# Patient Record
Sex: Female | Born: 1965 | Race: Black or African American | Hispanic: No | Marital: Single | Smoking: Former smoker
Health system: Southern US, Community
[De-identification: ages and names within clinical notes are randomized; demographics above are authoritative.]

## PROBLEM LIST (undated history)

## (undated) DIAGNOSIS — U071 COVID-19: Secondary | ICD-10-CM

## (undated) DIAGNOSIS — N390 Urinary tract infection, site not specified: Secondary | ICD-10-CM

## (undated) DIAGNOSIS — Z9289 Personal history of other medical treatment: Secondary | ICD-10-CM

## (undated) DIAGNOSIS — G43909 Migraine, unspecified, not intractable, without status migrainosus: Secondary | ICD-10-CM

## (undated) DIAGNOSIS — D496 Neoplasm of unspecified behavior of brain: Secondary | ICD-10-CM

## (undated) DIAGNOSIS — R569 Unspecified convulsions: Secondary | ICD-10-CM

## (undated) DIAGNOSIS — T7840XA Allergy, unspecified, initial encounter: Secondary | ICD-10-CM

## (undated) HISTORY — PX: BRAIN TUMOR EXCISION: SHX577

## (undated) HISTORY — PX: KNEE SURGERY: SHX244

## (undated) HISTORY — DX: Migraine, unspecified, not intractable, without status migrainosus: G43.909

## (undated) HISTORY — DX: Allergy, unspecified, initial encounter: T78.40XA

## (undated) HISTORY — DX: Unspecified convulsions: R56.9

## (undated) HISTORY — DX: Personal history of other medical treatment: Z92.89

## (undated) HISTORY — PX: FOOT SURGERY: SHX648

## (undated) HISTORY — DX: Urinary tract infection, site not specified: N39.0

## (undated) HISTORY — DX: Neoplasm of unspecified behavior of brain: D49.6

## (undated) HISTORY — DX: COVID-19: U07.1

---

## 2000-09-03 ENCOUNTER — Encounter: Payer: Self-pay | Admitting: Emergency Medicine

## 2000-09-03 ENCOUNTER — Emergency Department (HOSPITAL_COMMUNITY): Admission: EM | Admit: 2000-09-03 | Discharge: 2000-09-04 | Payer: Self-pay | Admitting: Emergency Medicine

## 2000-11-27 ENCOUNTER — Emergency Department (HOSPITAL_COMMUNITY): Admission: EM | Admit: 2000-11-27 | Discharge: 2000-11-27 | Payer: Self-pay | Admitting: Emergency Medicine

## 2000-12-03 ENCOUNTER — Ambulatory Visit (HOSPITAL_BASED_OUTPATIENT_CLINIC_OR_DEPARTMENT_OTHER): Admission: RE | Admit: 2000-12-03 | Discharge: 2000-12-03 | Payer: Self-pay | Admitting: Orthopedic Surgery

## 2004-05-30 ENCOUNTER — Other Ambulatory Visit: Payer: Self-pay

## 2004-07-13 ENCOUNTER — Emergency Department: Payer: Self-pay | Admitting: Emergency Medicine

## 2004-08-09 ENCOUNTER — Emergency Department: Payer: Self-pay | Admitting: Emergency Medicine

## 2004-08-16 ENCOUNTER — Emergency Department: Payer: Self-pay | Admitting: Emergency Medicine

## 2004-08-17 ENCOUNTER — Emergency Department: Payer: Self-pay | Admitting: Emergency Medicine

## 2004-10-26 ENCOUNTER — Emergency Department: Payer: Self-pay | Admitting: Internal Medicine

## 2004-11-25 ENCOUNTER — Emergency Department: Payer: Self-pay | Admitting: Emergency Medicine

## 2005-01-14 ENCOUNTER — Emergency Department: Payer: Self-pay | Admitting: General Practice

## 2005-02-09 ENCOUNTER — Emergency Department: Payer: Self-pay | Admitting: Emergency Medicine

## 2005-02-22 ENCOUNTER — Emergency Department: Payer: Self-pay | Admitting: Emergency Medicine

## 2005-06-16 ENCOUNTER — Emergency Department: Payer: Self-pay | Admitting: Emergency Medicine

## 2005-08-08 ENCOUNTER — Emergency Department: Payer: Self-pay | Admitting: Emergency Medicine

## 2006-05-24 ENCOUNTER — Emergency Department: Payer: Self-pay | Admitting: Emergency Medicine

## 2006-10-04 ENCOUNTER — Emergency Department: Payer: Self-pay

## 2006-10-10 ENCOUNTER — Emergency Department: Payer: Self-pay | Admitting: Emergency Medicine

## 2007-02-05 ENCOUNTER — Emergency Department: Payer: Self-pay | Admitting: Internal Medicine

## 2007-04-18 ENCOUNTER — Emergency Department: Payer: Self-pay | Admitting: Emergency Medicine

## 2007-05-08 ENCOUNTER — Emergency Department: Payer: Self-pay | Admitting: Emergency Medicine

## 2007-06-18 ENCOUNTER — Emergency Department: Payer: Self-pay | Admitting: Emergency Medicine

## 2007-08-07 ENCOUNTER — Emergency Department: Payer: Self-pay | Admitting: Internal Medicine

## 2007-09-05 ENCOUNTER — Ambulatory Visit: Payer: Self-pay | Admitting: Pain Medicine

## 2007-09-25 ENCOUNTER — Emergency Department: Payer: Self-pay | Admitting: Internal Medicine

## 2007-10-13 ENCOUNTER — Emergency Department: Payer: Self-pay | Admitting: Emergency Medicine

## 2007-11-01 ENCOUNTER — Ambulatory Visit: Payer: Self-pay | Admitting: Pain Medicine

## 2007-11-20 ENCOUNTER — Emergency Department: Payer: Self-pay | Admitting: Emergency Medicine

## 2007-12-12 ENCOUNTER — Emergency Department: Payer: Self-pay | Admitting: Emergency Medicine

## 2007-12-21 ENCOUNTER — Ambulatory Visit: Payer: Self-pay | Admitting: Pain Medicine

## 2008-02-07 ENCOUNTER — Emergency Department: Payer: Self-pay | Admitting: Emergency Medicine

## 2008-03-13 ENCOUNTER — Emergency Department: Payer: Self-pay | Admitting: Emergency Medicine

## 2008-05-03 ENCOUNTER — Emergency Department: Payer: Self-pay | Admitting: Emergency Medicine

## 2008-05-30 ENCOUNTER — Ambulatory Visit: Payer: Self-pay

## 2008-06-04 ENCOUNTER — Emergency Department: Payer: Self-pay | Admitting: Internal Medicine

## 2008-06-05 ENCOUNTER — Ambulatory Visit: Payer: Self-pay

## 2008-07-11 ENCOUNTER — Ambulatory Visit: Payer: Self-pay | Admitting: Pain Medicine

## 2008-07-29 ENCOUNTER — Emergency Department: Payer: Self-pay | Admitting: Emergency Medicine

## 2008-08-28 ENCOUNTER — Ambulatory Visit: Payer: Self-pay | Admitting: Physician Assistant

## 2008-10-11 ENCOUNTER — Emergency Department: Payer: Self-pay | Admitting: Internal Medicine

## 2008-10-24 ENCOUNTER — Ambulatory Visit: Payer: Self-pay | Admitting: Pain Medicine

## 2008-11-19 ENCOUNTER — Emergency Department: Payer: Self-pay

## 2008-11-29 ENCOUNTER — Ambulatory Visit: Payer: Self-pay | Admitting: Physician Assistant

## 2008-12-18 ENCOUNTER — Emergency Department: Payer: Self-pay | Admitting: Emergency Medicine

## 2008-12-26 ENCOUNTER — Ambulatory Visit: Payer: Self-pay | Admitting: Physician Assistant

## 2009-02-25 ENCOUNTER — Emergency Department: Payer: Self-pay | Admitting: Emergency Medicine

## 2009-05-07 ENCOUNTER — Ambulatory Visit: Payer: Self-pay | Admitting: Pain Medicine

## 2009-05-22 ENCOUNTER — Ambulatory Visit: Payer: Self-pay | Admitting: Physician Assistant

## 2009-07-17 ENCOUNTER — Ambulatory Visit: Payer: Self-pay

## 2009-07-23 ENCOUNTER — Ambulatory Visit: Payer: Self-pay | Admitting: Physician Assistant

## 2009-08-15 ENCOUNTER — Emergency Department: Payer: Self-pay | Admitting: Unknown Physician Specialty

## 2009-09-04 ENCOUNTER — Ambulatory Visit: Payer: Self-pay | Admitting: Unknown Physician Specialty

## 2011-03-08 ENCOUNTER — Emergency Department: Payer: Self-pay | Admitting: Emergency Medicine

## 2012-05-01 ENCOUNTER — Emergency Department: Payer: Self-pay | Admitting: Unknown Physician Specialty

## 2012-05-01 LAB — URINALYSIS, COMPLETE
Bilirubin,UR: NEGATIVE
Blood: NEGATIVE
Glucose,UR: NEGATIVE mg/dL (ref 0–75)
Ketone: NEGATIVE
Nitrite: POSITIVE
Ph: 5 (ref 4.5–8.0)
Protein: NEGATIVE
RBC,UR: NONE SEEN /HPF (ref 0–5)
Specific Gravity: 1.026 (ref 1.003–1.030)
Squamous Epithelial: 4
WBC UR: 16 /HPF (ref 0–5)

## 2012-05-18 ENCOUNTER — Emergency Department: Payer: Self-pay | Admitting: Emergency Medicine

## 2012-07-02 ENCOUNTER — Emergency Department: Payer: Self-pay | Admitting: Emergency Medicine

## 2012-07-02 LAB — PREGNANCY, URINE: Pregnancy Test, Urine: NEGATIVE m[IU]/mL

## 2012-07-02 LAB — CBC
HCT: 37 % (ref 35.0–47.0)
HGB: 12.6 g/dL (ref 12.0–16.0)
MCH: 28.4 pg (ref 26.0–34.0)
MCHC: 33.9 g/dL (ref 32.0–36.0)
MCV: 84 fL (ref 80–100)
Platelet: 263 10*3/uL (ref 150–440)
RBC: 4.43 10*6/uL (ref 3.80–5.20)
RDW: 15.6 % — ABNORMAL HIGH (ref 11.5–14.5)
WBC: 13.3 10*3/uL — ABNORMAL HIGH (ref 3.6–11.0)

## 2012-07-02 LAB — URINALYSIS, COMPLETE
Bilirubin,UR: NEGATIVE
Glucose,UR: NEGATIVE mg/dL (ref 0–75)
Ketone: NEGATIVE
Leukocyte Esterase: NEGATIVE
Nitrite: NEGATIVE
Ph: 6 (ref 4.5–8.0)
Protein: 25
RBC,UR: 2570 /HPF (ref 0–5)
Specific Gravity: 1.03 (ref 1.003–1.030)
Squamous Epithelial: 2
WBC UR: 4 /HPF (ref 0–5)

## 2012-08-20 ENCOUNTER — Emergency Department: Payer: Self-pay | Admitting: Emergency Medicine

## 2013-02-24 ENCOUNTER — Emergency Department: Payer: Self-pay | Admitting: Emergency Medicine

## 2013-02-24 LAB — URINALYSIS, COMPLETE
Bilirubin,UR: NEGATIVE
Blood: NEGATIVE
Glucose,UR: NEGATIVE mg/dL (ref 0–75)
Ketone: NEGATIVE
Ph: 6 (ref 4.5–8.0)
Protein: NEGATIVE
RBC,UR: 9 /HPF (ref 0–5)
Specific Gravity: 1.024 (ref 1.003–1.030)
WBC UR: 42 /HPF (ref 0–5)

## 2013-02-25 LAB — WET PREP, GENITAL

## 2013-02-25 LAB — GC/CHLAMYDIA PROBE AMP

## 2013-03-13 ENCOUNTER — Emergency Department: Payer: Self-pay | Admitting: Emergency Medicine

## 2013-03-13 LAB — COMPREHENSIVE METABOLIC PANEL
Alkaline Phosphatase: 52 U/L (ref 50–136)
Anion Gap: 7 (ref 7–16)
BUN: 17 mg/dL (ref 7–18)
Bilirubin,Total: 0.3 mg/dL (ref 0.2–1.0)
Co2: 27 mmol/L (ref 21–32)
Creatinine: 0.86 mg/dL (ref 0.60–1.30)
EGFR (Non-African Amer.): >60
Glucose: 90 mg/dL (ref 65–99)
Potassium: 3.5 mmol/L (ref 3.5–5.1)
SGOT(AST): 14 U/L — ABNORMAL LOW (ref 15–37)
SGPT (ALT): 24 U/L (ref 12–78)
Total Protein: 8.5 g/dL — ABNORMAL HIGH (ref 6.4–8.2)

## 2013-03-13 LAB — URINALYSIS, COMPLETE
Bilirubin,UR: NEGATIVE
Glucose,UR: NEGATIVE mg/dL (ref 0–75)
Ketone: NEGATIVE
Nitrite: NEGATIVE
RBC,UR: 1 /HPF (ref 0–5)
Specific Gravity: 1.03 (ref 1.003–1.030)
Squamous Epithelial: 5
WBC UR: 2 /HPF (ref 0–5)

## 2013-03-13 LAB — CBC
HCT: 37 % (ref 35.0–47.0)
HGB: 12.3 g/dL (ref 12.0–16.0)
MCH: 27.5 pg (ref 26.0–34.0)
MCHC: 33.2 g/dL (ref 32.0–36.0)
RDW: 14.8 % — ABNORMAL HIGH (ref 11.5–14.5)

## 2013-03-13 LAB — TROPONIN I: Troponin-I: <0.02 ng/mL

## 2013-04-17 ENCOUNTER — Emergency Department: Payer: Self-pay | Admitting: Emergency Medicine

## 2013-04-17 LAB — URINALYSIS, COMPLETE
Blood: NEGATIVE
Glucose,UR: NEGATIVE mg/dL (ref 0–75)
Leukocyte Esterase: NEGATIVE
Ph: 6 (ref 4.5–8.0)
Protein: NEGATIVE
RBC,UR: 1 /HPF (ref 0–5)
WBC UR: 2 /HPF (ref 0–5)

## 2013-06-27 ENCOUNTER — Emergency Department (HOSPITAL_COMMUNITY)
Admission: EM | Admit: 2013-06-27 | Discharge: 2013-06-27 | Disposition: A | Discharge status: Home / Self Care | Payer: Worker's Compensation | Attending: Emergency Medicine | Admitting: Emergency Medicine

## 2013-06-27 ENCOUNTER — Encounter (HOSPITAL_COMMUNITY): Payer: Self-pay | Admitting: Emergency Medicine

## 2013-06-27 DIAGNOSIS — G8911 Acute pain due to trauma: Secondary | ICD-10-CM | POA: Insufficient documentation

## 2013-06-27 DIAGNOSIS — M25569 Pain in unspecified knee: Secondary | ICD-10-CM | POA: Insufficient documentation

## 2013-06-27 DIAGNOSIS — M25562 Pain in left knee: Secondary | ICD-10-CM

## 2013-06-27 MED ORDER — OXYCODONE-ACETAMINOPHEN 5-325 MG PO TABS
2.0000 | ORAL_TABLET | Freq: Once | ORAL | Status: AC
  Administered 2013-06-27: 2 via ORAL
  Filled 2013-06-27: qty 2

## 2013-06-27 MED ORDER — OXYCODONE-ACETAMINOPHEN 5-325 MG PO TABS: 2.0000 | ORAL_TABLET | Freq: Four times a day (QID) | ORAL | Status: DC | PRN

## 2013-06-27 MED ORDER — ONDANSETRON HCL 4 MG PO TABS: 4.0000 mg | ORAL_TABLET | Freq: Four times a day (QID) | ORAL | Status: DC

## 2013-06-27 MED ORDER — ONDANSETRON 8 MG PO TBDP
8.0000 mg | ORAL_TABLET | Freq: Once | ORAL | Status: AC
Start: 2013-06-27 — End: 2013-06-27
  Administered 2013-06-27: 8 mg via ORAL
  Filled 2013-06-27: qty 1
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
## 2013-06-29 NOTE — ED Provider Notes (Signed)
Medical screening examination/treatment/procedure(s) were performed by non-physician practitioner and as supervising physician I was immediately available for consultation/collaboration.     Bohdan Macho R Oleta Gunnoe, MD 06/29/13 0000 

## 2013-07-13 ENCOUNTER — Emergency Department: Payer: Self-pay | Admitting: Emergency Medicine

## 2013-07-13 LAB — URINALYSIS, COMPLETE
Bacteria: NONE SEEN
Ph: 5 (ref 4.5–8.0)

## 2013-11-27 IMAGING — US TRANSABDOMINAL ULTRASOUND OF PELVIS
1 series · 14 of 25 positions shown · non-contrast
Comparison: none

REASON FOR EXAM: HEAVY VAG BLEEDING
COMMENTS:   May transport without cardiac monitor

PROCEDURE:     US  - US PELVIS EXAM  - July 02, 2012  [DATE]
RESULT:     History: Vaginal bleeding.

[Series 1: transabdominal ultrasound of pelvis · 0.21mm/px · 14 of 43 slices shown]
[im 1/43]
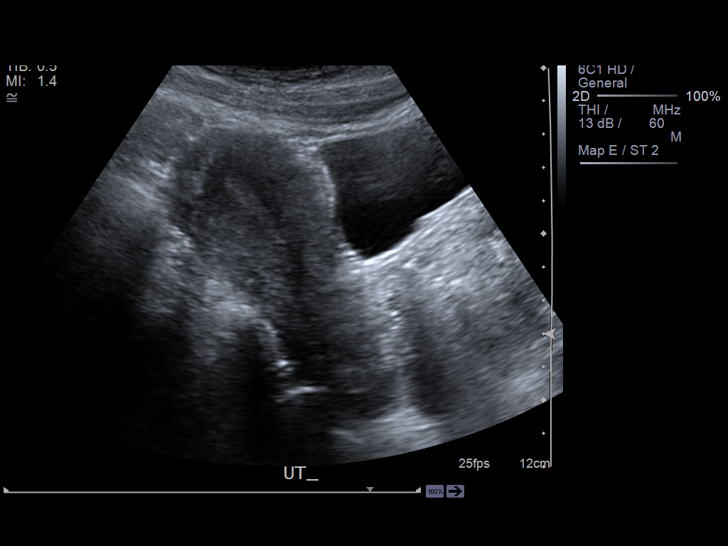
[im 4/43]
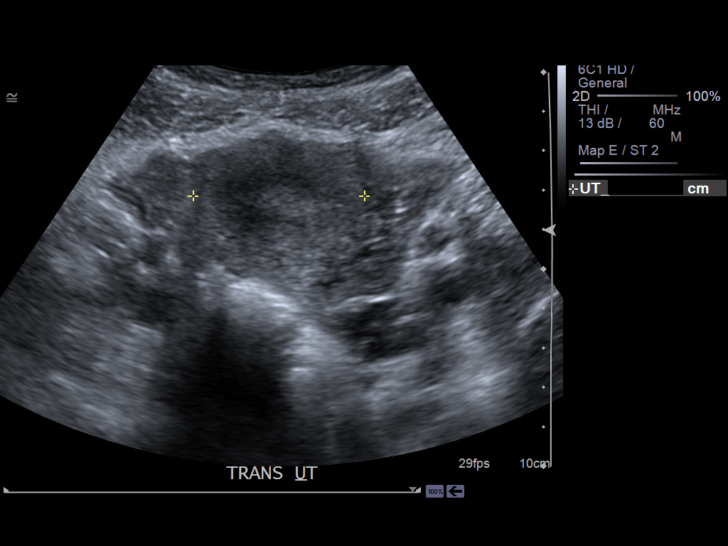
[im 8/43]
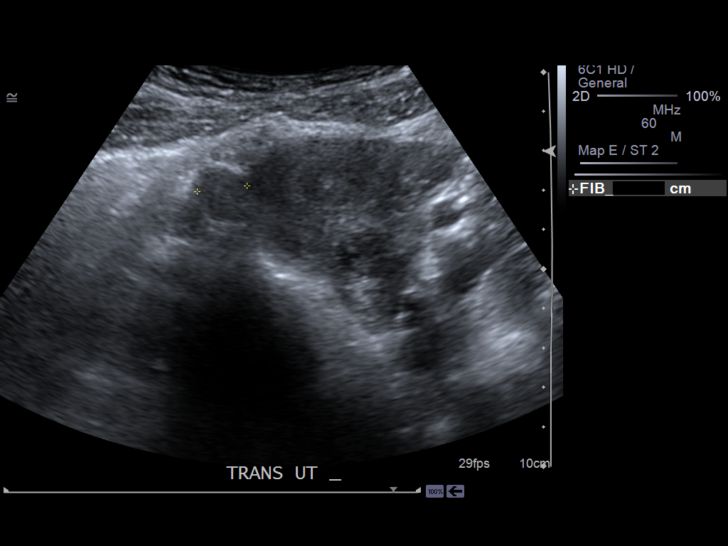
[im 11/43]
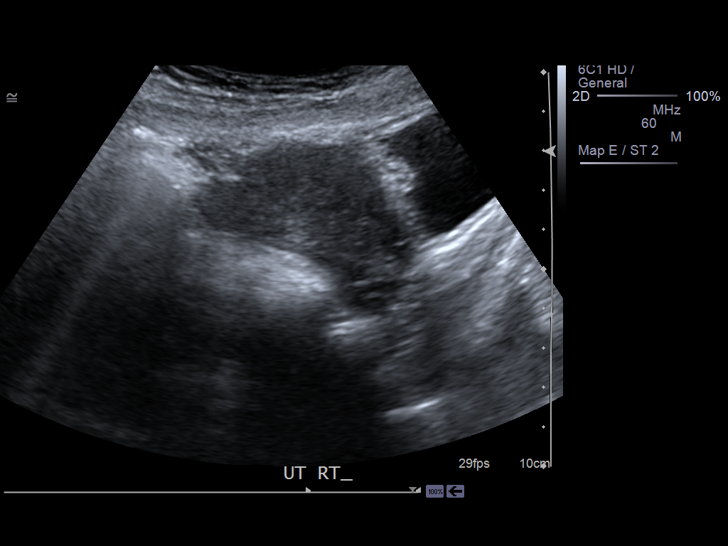
[im 15/43]
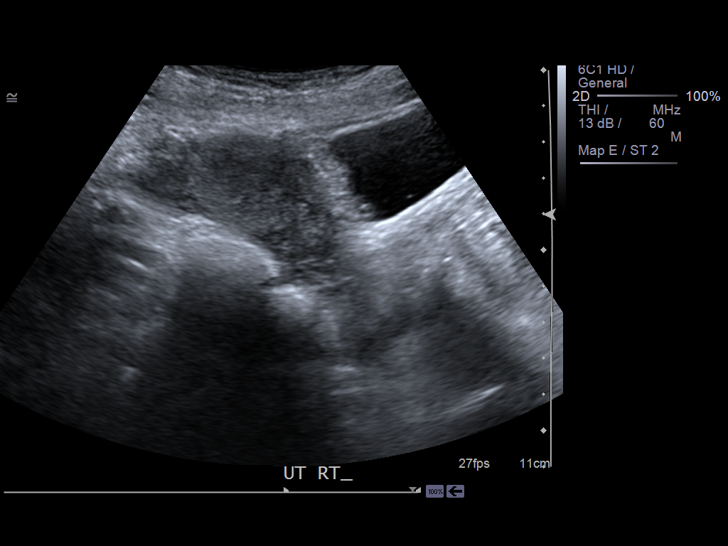
[im 16/43]
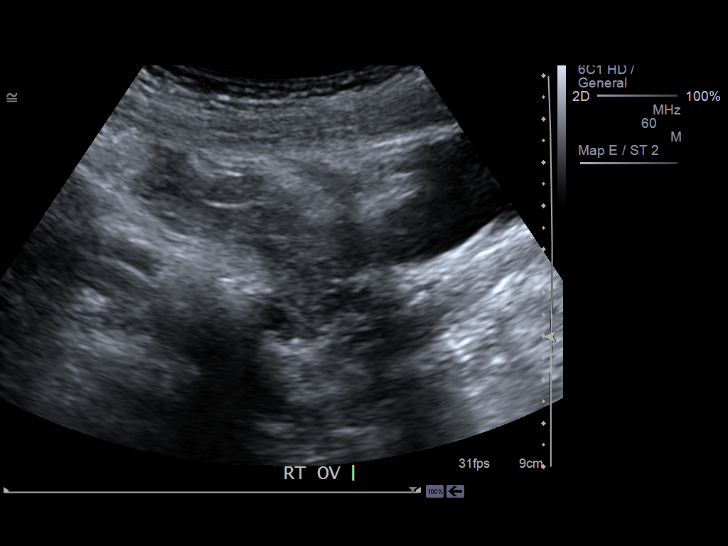
[im 20/43]
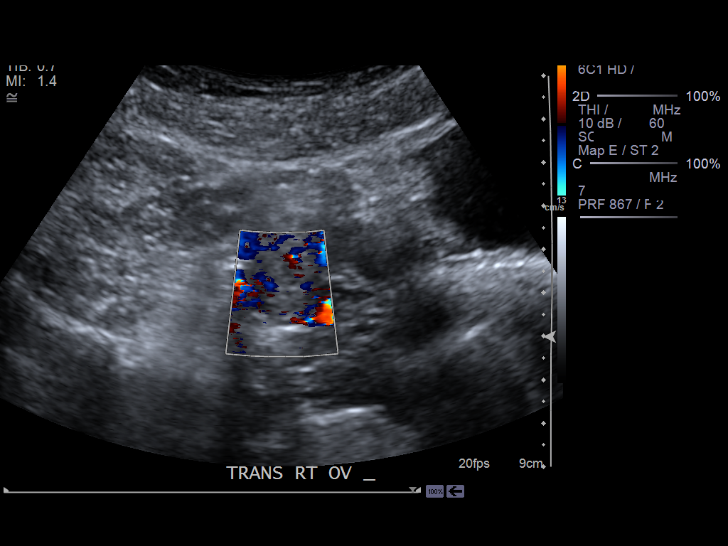
[im 23/43]
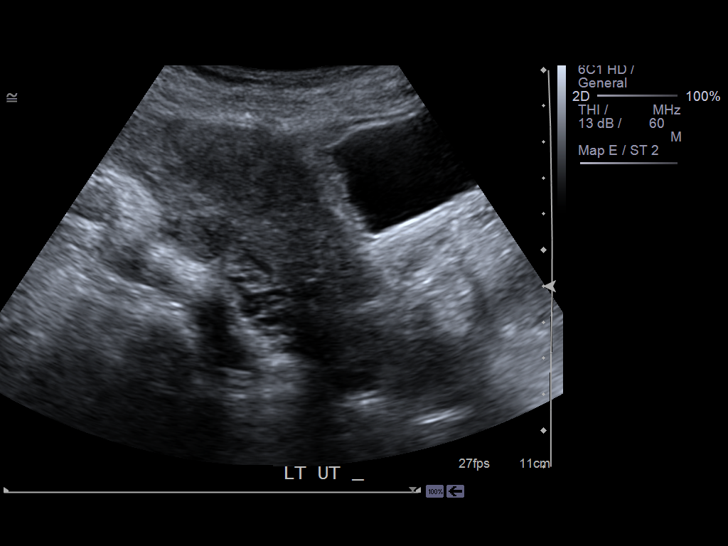
[im 27/43]
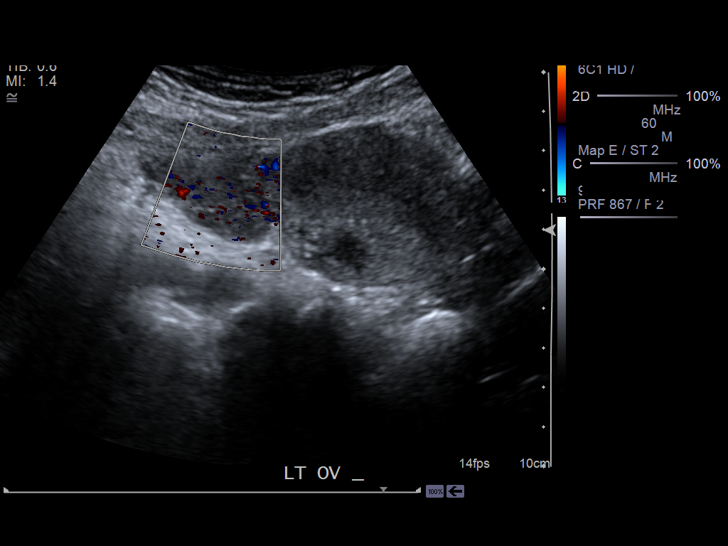
[im 29/43]
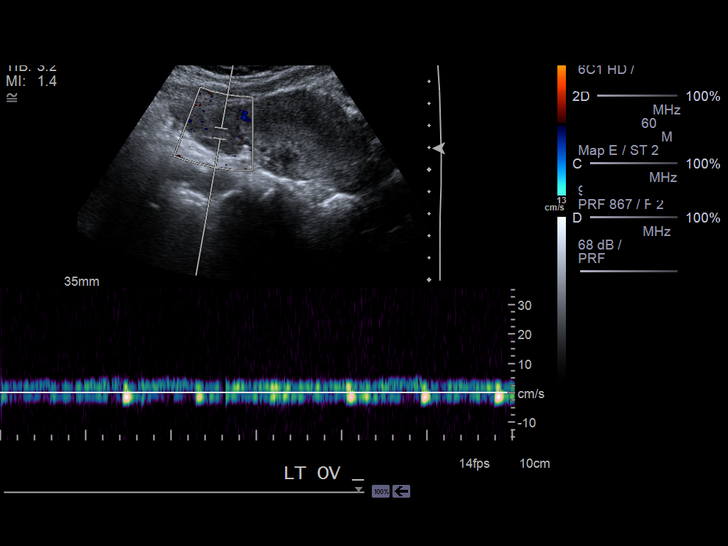
[im 32/43]
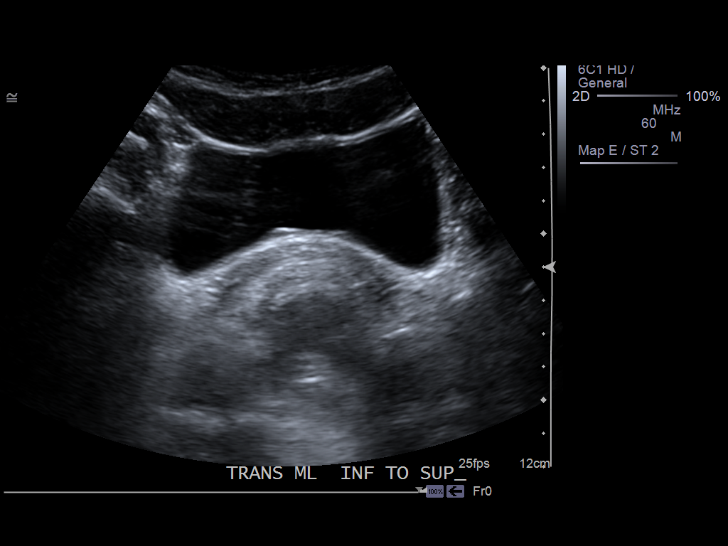
[im 36/43]
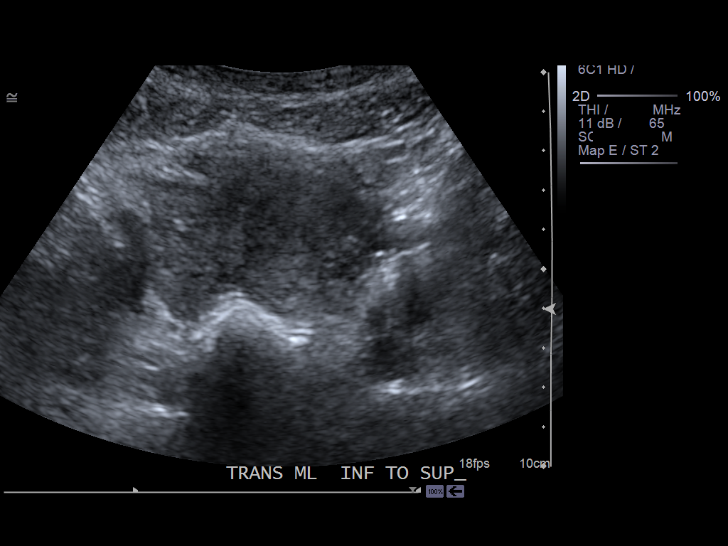
[im 39/43]
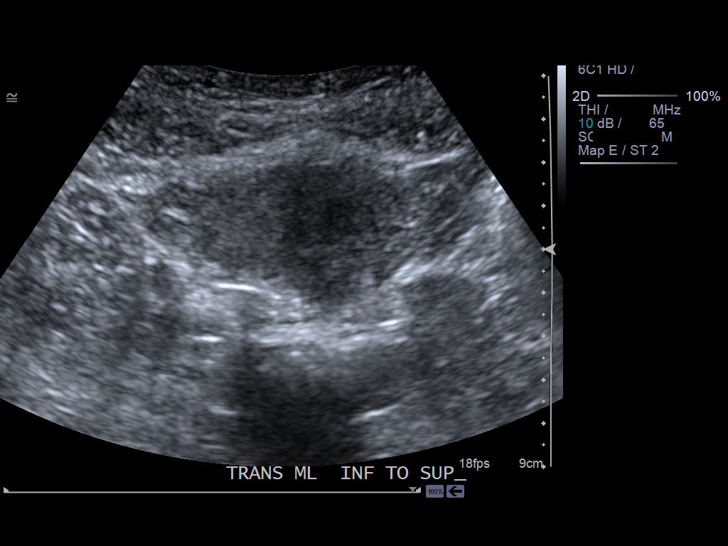
[im 43/43]
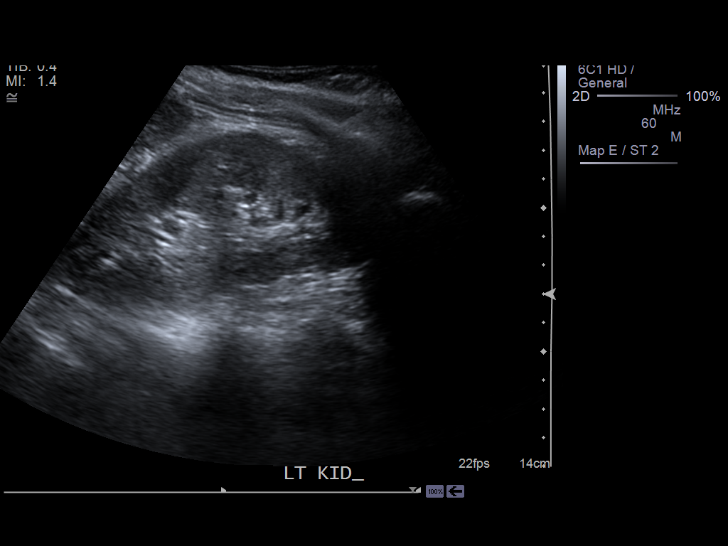

[14 of 25 positions shown; findings below may reference images not displayed]

FINDINGS: Size, shape, and echotexture uterus is normal. Two approximately
1.6 cm uterine fibroids are noted; endometrial thickness is 7.5 mm. Ovaries
are normal with normal bilateral blood flow. Maximum  diameter right ovary
2.4 cm, left 3.4 cm. No hydronephrosis.
IMPRESSION: Fibroid uterus, otherwise normal exam.

## 2014-02-27 ENCOUNTER — Emergency Department: Payer: Self-pay | Admitting: Emergency Medicine

## 2014-02-27 LAB — BASIC METABOLIC PANEL
Anion Gap: 5 — ABNORMAL LOW (ref 7–16)
BUN: 14 mg/dL (ref 7–18)
Calcium, Total: 9.6 mg/dL (ref 8.5–10.1)
Chloride: 105 mmol/L (ref 98–107)
Co2: 30 mmol/L (ref 21–32)
Creatinine: 0.84 mg/dL (ref 0.60–1.30)
EGFR (African American): >60
EGFR (Non-African Amer.): >60
Glucose: 98 mg/dL (ref 65–99)
OSMOLALITY: 280 (ref 275–301)
POTASSIUM: 4.1 mmol/L (ref 3.5–5.1)
Sodium: 140 mmol/L (ref 136–145)

## 2014-02-27 LAB — TROPONIN I: Troponin-I: <0.02 ng/mL

## 2014-02-27 LAB — CBC WITH DIFFERENTIAL/PLATELET
Basophil #: 0.1 10*3/uL (ref 0.0–0.1)
Basophil %: 0.6 %
Eosinophil #: 0.3 10*3/uL (ref 0.0–0.7)
Eosinophil %: 2 %
HCT: 39.5 % (ref 35.0–47.0)
HGB: 12.9 g/dL (ref 12.0–16.0)
Lymphocyte #: 6.2 10*3/uL — ABNORMAL HIGH (ref 1.0–3.6)
Lymphocyte %: 38.6 %
MCH: 27.4 pg (ref 26.0–34.0)
MCHC: 32.7 g/dL (ref 32.0–36.0)
MCV: 84 fL (ref 80–100)
Monocyte #: 1 x10 3/mm — ABNORMAL HIGH (ref 0.2–0.9)
Monocyte %: 6 %
NEUTROS ABS: 8.5 10*3/uL — AB (ref 1.4–6.5)
Neutrophil %: 52.8 %
Platelet: 241 10*3/uL (ref 150–440)
RBC: 4.72 10*6/uL (ref 3.80–5.20)
RDW: 15.2 % — ABNORMAL HIGH (ref 11.5–14.5)
WBC: 16.1 10*3/uL — ABNORMAL HIGH (ref 3.6–11.0)

## 2015-06-10 ENCOUNTER — Emergency Department
Admission: EM | Admit: 2015-06-10 | Discharge: 2015-06-10 | Disposition: A | Discharge status: Home / Self Care | Payer: Self-pay | Attending: Emergency Medicine | Admitting: Emergency Medicine

## 2015-06-10 ENCOUNTER — Encounter: Payer: Self-pay | Admitting: Emergency Medicine

## 2015-06-10 DIAGNOSIS — Z79899 Other long term (current) drug therapy: Secondary | ICD-10-CM | POA: Insufficient documentation

## 2015-06-10 DIAGNOSIS — A599 Trichomoniasis, unspecified: Secondary | ICD-10-CM

## 2015-06-10 DIAGNOSIS — R21 Rash and other nonspecific skin eruption: Secondary | ICD-10-CM | POA: Insufficient documentation

## 2015-06-10 DIAGNOSIS — Z3202 Encounter for pregnancy test, result negative: Secondary | ICD-10-CM | POA: Insufficient documentation

## 2015-06-10 DIAGNOSIS — A5909 Other urogenital trichomoniasis: Secondary | ICD-10-CM | POA: Insufficient documentation

## 2015-06-10 DIAGNOSIS — N898 Other specified noninflammatory disorders of vagina: Secondary | ICD-10-CM

## 2015-06-10 DIAGNOSIS — Z720 Tobacco use: Secondary | ICD-10-CM | POA: Insufficient documentation

## 2015-06-10 LAB — URINALYSIS COMPLETE WITH MICROSCOPIC (ARMC ONLY)
Bilirubin Urine: NEGATIVE
Glucose, UA: NEGATIVE mg/dL
HGB URINE DIPSTICK: NEGATIVE
Nitrite: NEGATIVE
PH: 5 (ref 5.0–8.0)
PROTEIN: NEGATIVE mg/dL
SPECIFIC GRAVITY, URINE: 1.031 — AB (ref 1.005–1.030)

## 2015-06-10 LAB — WET PREP, GENITAL
CLUE CELLS WET PREP: NONE SEEN
YEAST WET PREP: NONE SEEN

## 2015-06-10 LAB — CHLAMYDIA/NGC RT PCR (ARMC ONLY)
CHLAMYDIA TR: NOT DETECTED
N gonorrhoeae: NOT DETECTED

## 2015-06-10 LAB — POCT PREGNANCY, URINE: PREG TEST UR: NEGATIVE

## 2015-06-10 MED ORDER — METRONIDAZOLE 500 MG PO TABS: 500.0000 mg | ORAL_TABLET | Freq: Two times a day (BID) | ORAL | Status: AC

## 2015-06-10 MED ORDER — METRONIDAZOLE 500 MG PO TABS
500.0000 mg | ORAL_TABLET | Freq: Once | ORAL | Status: AC
  Administered 2015-06-10: 500 mg via ORAL
  Filled 2015-06-10: qty 1

## 2015-06-10 NOTE — ED Provider Notes (Signed)
[facility] Emergency Department Provider Note  ____________________________________________  Time seen: Approximately 238 AM  I have reviewed the triage vital signs and the nursing notes.   HISTORY  Chief Complaint Vaginal Discharge    HPI Carmen Cooley is a 49 y.o. female who comes in with vaginal discharge. The patient reports that she has a rash on her thighs where they rub together. She reports that yesterday started having some vaginal discharge it was very bad. The patient reports that she's been wetting through her close in her pants so she stopped wearing pants to let air try her vagina. The patient reports that she has continued having discharge so severe that it is irritating the rash on her thighs. She reports that the discharge is a grayish white discharge. The patient thought it may be yeast. She reports that she's had some itching but no more than normal. The patient reports that she is also going through menopause she denies any foreign body and she was sexually active one week ago without protection. The patient was concerned so she decided to come in for evaluation   History reviewed. No pertinent past medical history.  There are no active problems to display for this patient.   Past Surgical History  Procedure Laterality Date  . Brain tumor excision      benign  . Knee surgery    . Foot surgery      Current Outpatient Rx  Name  Route  Sig  Dispense  Refill  . HYDROcodone-acetaminophen (NORCO/VICODIN) 5-325 MG per tablet   Oral   Take 1-2 tablets by mouth at bedtime as needed for pain.         Marland Kitchen ibuprofen (ADVIL,MOTRIN) 800 MG tablet   Oral   Take 800 mg by mouth every 12 (twelve) hours as needed for pain.         . metroNIDAZOLE (FLAGYL) 500 MG tablet   Oral   Take 1 tablet (500 mg total) by mouth 2 (two) times daily.   14 tablet   0   . Multiple Vitamin (MULTI-VITAMIN DAILY PO)   Oral   Take 1 tablet by mouth daily.          . ondansetron (ZOFRAN) 4 MG tablet   Oral   Take 1 tablet (4 mg total) by mouth every 6 (six) hours.   12 tablet   0   . oxyCODONE-acetaminophen (PERCOCET/ROXICET) 5-325 MG per tablet   Oral   Take 2 tablets by mouth every 6 (six) hours as needed for pain.   12 tablet   0     Allergies Compazine and Contrast media  Family History  Problem Relation Age of Onset  . Diabetes Father   . Cancer Father   . Cancer Other     Social History Social History  Substance Use Topics  . Smoking status: Current Every Day Smoker -- 1.00 packs/day    Types: Cigarettes  . Smokeless tobacco: None  . Alcohol Use: Yes     Comment: occasional    Review of Systems Constitutional: No fever/chills Eyes: No visual changes. ENT: No sore throat. Cardiovascular: Denies chest pain. Respiratory: Denies shortness of breath. Gastrointestinal: No abdominal pain.  No nausea, no vomiting.  No diarrhea.  No constipation. Genitourinary: Vaginal discharge Musculoskeletal: Negative for back pain. Skin:  rash. Neurological: Negative for headaches, focal weakness or numbness.  10-point ROS otherwise negative.  ____________________________________________   PHYSICAL EXAM:  VITAL SIGNS: ED Triage Vitals  Enc Vitals Group     BP 06/10/15 0106 119/75 mmHg     Pulse Rate 06/10/15 0106 88     Resp 06/10/15 0106 18     Temp 06/10/15 0106 98.1 F (36.7 C)     Temp Source 06/10/15 0106 Oral     SpO2 06/10/15 0106 97 %     Weight 06/10/15 0106 200 lb (90.719 kg)     Height 06/10/15 0106 5\' 6"  (1.676 m)     Head Cir --      Peak Flow --      Pain Score --      Pain Loc --      Pain Edu? --      Excl. in [place]? --     Constitutional: Alert and oriented. Well appearing and in mild distress. Eyes: Conjunctivae are normal. PERRL. EOMI. Head: Atraumatic. Nose: No congestion/rhinnorhea. Mouth/Throat: Mucous membranes are moist.  Oropharynx non-erythematous. Cardiovascular: Normal rate, regular  rhythm. Grossly normal heart sounds.  Good peripheral circulation. Respiratory: Normal respiratory effort.  No retractions. Lungs CTAB. Gastrointestinal: Soft and nontender. No distention. Positive bowel sounds Genitourinary: Copious greenish yellow vaginal discharge, irritated appearing child genitalia. No cervical motion tenderness, no adnexal tenderness to palpation Musculoskeletal: No lower extremity tenderness nor edema.   Neurologic:  Normal speech and language.  Skin:  Skin is warm, dry and intact. Mild erythema to bilateral medial thighs Psychiatric: Mood and affect are normal.   ____________________________________________   LABS (all labs ordered are listed, but only abnormal results are displayed)  Labs Reviewed  WET PREP, GENITAL - Abnormal; Notable for the following:    Trich, Wet Prep MODERATE (*)    WBC, Wet Prep HPF POC MANY (*)    All other components within normal limits  URINALYSIS COMPLETEWITH MICROSCOPIC ([facility] ONLY) - Abnormal; Notable for the following:    Color, Urine YELLOW (*)    APPearance HAZY (*)    Ketones, ur TRACE (*)    Specific Gravity, Urine 1.031 (*)    Leukocytes, UA 3+ (*)    Bacteria, UA RARE (*)    Squamous Epithelial / LPF 0-5 (*)    All other components within normal limits  CHLAMYDIA/NGC RT PCR ([facility] ONLY)  POCT PREGNANCY, URINE   ____________________________________________  EKG  None ____________________________________________  RADIOLOGY  None ____________________________________________   PROCEDURES  Procedure(s) performed: None  Critical Care performed: No  ____________________________________________   INITIAL IMPRESSION / ASSESSMENT AND PLAN / ED COURSE  Pertinent labs & imaging results that were available during my care of the patient were reviewed by me and considered in my medical decision making (see chart for details).  This is a 49 year old female who comes in today with a rash and vaginal  discharge. I tested the patient's discharge and it was found that she has Trichomonas. Patient also has a GC and chlamydia which is pending at this time. I gave the patient a dose of metronidazole for her symptoms and informed her that she should follow up with OB/GYN. I also did tell her that should the GC or chlamydia returned positive we will call her for treatment. Otherwise the patient had no further complaints or concerns. I showed her to keep the rash and area on her thighs clean and dry and she will follow up with OB/GYN. ____________________________________________   FINAL CLINICAL IMPRESSION(S) / ED DIAGNOSES  Final diagnoses:  Trichomonas vaginalis infection  Vaginal discharge      Loney Hering, MD 06/10/15 0422

## 2015-06-10 NOTE — ED Notes (Signed)
MD at bedside. 

## 2015-06-10 NOTE — Discharge Instructions (Signed)
Trichomoniasis °Trichomoniasis is an infection caused by an organism called Trichomonas. The infection can affect both women and men. In women, the outer female genitalia and the vagina are affected. In men, the penis is mainly affected, but the prostate and other reproductive organs can also be involved. Trichomoniasis is a sexually transmitted infection (STI) and is most often passed to another person through sexual contact.  °RISK FACTORS °· Having unprotected sexual intercourse. °· Having sexual intercourse with an infected partner. °SIGNS AND SYMPTOMS  °Symptoms of trichomoniasis in women include: °· Abnormal gray-green frothy vaginal discharge. °· Itching and irritation of the vagina. °· Itching and irritation of the area outside the vagina. °Symptoms of trichomoniasis in men include:  °· Penile discharge with or without pain. °· Pain during urination. This results from inflammation of the urethra. °DIAGNOSIS  °Trichomoniasis may be found during a Pap test or physical exam. Your health care provider may use one of the following methods to help diagnose this infection: °· Examining vaginal discharge under a microscope. For men, urethral discharge would be examined. °· Testing the pH of the vagina with a test tape. °· Using a vaginal swab test that checks for the Trichomonas organism. A test is available that provides results within a few minutes. °· Doing a culture test for the organism. This is not usually needed. °TREATMENT  °· You may be given medicine to fight the infection. Women should inform their health care provider if they could be or are pregnant. Some medicines used to treat the infection should not be taken during pregnancy. °· Your health care provider may recommend over-the-counter medicines or creams to decrease itching or irritation. °· Your sexual partner will need to be treated if infected. °HOME CARE INSTRUCTIONS  °· Take medicines only as directed by your health care provider. °· Take  over-the-counter medicine for itching or irritation as directed by your health care provider. °· Do not have sexual intercourse while you have the infection. °· Women should not douche or wear tampons while they have the infection. °· Discuss your infection with your partner. Your partner may have gotten the infection from you, or you may have gotten it from your partner. °· Have your sex partner get examined and treated if necessary. °· Practice safe, informed, and protected sex. °· See your health care provider for other STI testing. °SEEK MEDICAL CARE IF:  °· You still have symptoms after you finish your medicine. °· You develop abdominal pain. °· You have pain when you urinate. °· You have bleeding after sexual intercourse. °· You develop a rash. °· Your medicine makes you sick or makes you throw up (vomit). °MAKE SURE YOU: °· Understand these instructions. °· Will watch your condition. °· Will get help right away if you are not doing well or get worse. °Document Released: 03/10/2001 Document Revised: 01/29/2014 Document Reviewed: 06/26/2013 °ExitCare® Patient Information ©2015 [employer]. This information is not intended to replace advice given to you by your health care provider. Make sure you discuss any questions you have with your health care provider. ° °Sexually Transmitted Disease °A sexually transmitted disease (STD) is a disease or infection that may be passed (transmitted) from person to person, usually during sexual activity. This may happen by way of saliva, semen, blood, vaginal mucus, or urine. Common STDs include:  °· Gonorrhea.   °· Chlamydia.   °· Syphilis.   °· HIV and AIDS.   °· Genital herpes.   °· Hepatitis B and C.   °· Trichomonas.   °· Human papillomavirus (  HPV).   °· Pubic lice.   °· Scabies. °· Mites. °· Bacterial vaginosis. °WHAT ARE CAUSES OF STDs? °An STD may be caused by bacteria, a virus, or parasites. STDs are often transmitted during sexual activity if one person is infected.  However, they may also be transmitted through nonsexual means. STDs may be transmitted after:  °· Sexual intercourse with an infected person.   °· Sharing sex toys with an infected person.   °· Sharing needles with an infected person or using unclean piercing or tattoo needles. °· Having intimate contact with the genitals, mouth, or rectal areas of an infected person.   °· Exposure to infected fluids during birth. °WHAT ARE THE SIGNS AND SYMPTOMS OF STDs? °Different STDs have different symptoms. Some people may not have any symptoms. If symptoms are present, they may include:  °· Painful or bloody urination.   °· Pain in the pelvis, abdomen, vagina, anus, throat, or eyes.   °· A skin rash, itching, or irritation. °· Growths, ulcerations, blisters, or sores in the genital and anal areas. °· Abnormal vaginal discharge with or without bad odor.   °· Penile discharge in men.   °· Fever.   °· Pain or bleeding during sexual intercourse.   °· Swollen glands in the groin area.   °· Yellow skin and eyes (jaundice). This is seen with hepatitis.   °· Swollen testicles. °· Infertility. °· Sores and blisters in the mouth. °HOW ARE STDs DIAGNOSED? °To make a diagnosis, your health care provider may:  °· Take a medical history.   °· Perform a physical exam.   °· Take a sample of any discharge to examine. °· Swab the throat, cervix, opening to the penis, rectum, or vagina for testing. °· Test a sample of your first morning urine.   °· Perform blood tests.   °· Perform a Pap test, if this applies.   °· Perform a colposcopy.   °· Perform a laparoscopy.   °HOW ARE STDs TREATED? ° Treatment depends on the STD. Some STDs may be treated but not cured.  °· Chlamydia, gonorrhea, trichomonas, and syphilis can be cured with antibiotic medicine.   °· Genital herpes, hepatitis, and HIV can be treated, but not cured, with prescribed medicines. The medicines lessen symptoms.   °· Genital warts from HPV can be treated with medicine or by  freezing, burning (electrocautery), or surgery. Warts may come back.   °· HPV cannot be cured with medicine or surgery. However, abnormal areas may be removed from the cervix, vagina, or vulva.   °· If your diagnosis is confirmed, your recent sexual partners need treatment. This is true even if they are symptom-free or have a negative culture or evaluation. They should not have sex until their health care providers say it is okay. °HOW CAN I REDUCE MY RISK OF GETTING AN STD? °Take these steps to reduce your risk of getting an STD: °· Use latex condoms, dental dams, and water-soluble lubricants during sexual activity. Do not use petroleum jelly or oils. °· Avoid having multiple sex partners. °· Do not have sex with someone who has other sex partners. °· Do not have sex with anyone you do not know or who is at high risk for an STD. °· Avoid risky sex practices that can break your skin. °· Do not have sex if you have open sores on your mouth or skin. °· Avoid drinking too much alcohol or taking illegal drugs. Alcohol and drugs can affect your judgment and put you in a vulnerable position. °· Avoid engaging in oral and anal sex acts. °· Get vaccinated for HPV and hepatitis. If you   have not received these vaccines in the past, talk to your health care provider about whether one or both might be right for you.   °· If you are at risk of being infected with HIV, it is recommended that you take a prescription medicine daily to prevent HIV infection. This is called pre-exposure prophylaxis (PrEP). You are considered at risk if: °¨ You are a man who has sex with other men (MSM). °¨ You are a heterosexual man or woman and are sexually active with more than one partner. °¨ You take drugs by injection. °¨ You are sexually active with a partner who has HIV. °· Talk with your health care provider about whether you are at high risk of being infected with HIV. If you choose to begin PrEP, you should first be tested for HIV. You  should then be tested every 3 months for as long as you are taking PrEP.   °WHAT SHOULD I DO IF I THINK I HAVE AN STD? °· See your health care provider.   °· Tell your sexual partner(s). They should be tested and treated for any STDs. °· Do not have sex until your health care provider says it is okay.  °WHEN SHOULD I GET IMMEDIATE MEDICAL CARE? °Contact your health care provider right away if:  °· You have severe abdominal pain. °· You are a man and notice swelling or pain in your testicles. °· You are a woman and notice swelling or pain in your vagina. °Document Released: 12/05/2002 Document Revised: 09/19/2013 Document Reviewed: 04/04/2013 °ExitCare® Patient Information ©2015 [employer]. This information is not intended to replace advice given to you by your health care provider. Make sure you discuss any questions you have with your health care provider. ° °

## 2015-06-10 NOTE — ED Notes (Addendum)
Patient ambulatory to triage with steady gait, without difficulty or distress noted; pt reports vaginal discharge, grayish/white in color since yesterday; "it's dripping down my legs so much I ran out of underwear and I had to wear two dresses and just let it air out; everytime it drips down my legs it gaulds me and burns my thighs"

## 2016-04-01 ENCOUNTER — Emergency Department
Admission: EM | Admit: 2016-04-01 | Discharge: 2016-04-01 | Disposition: A | Discharge status: Home / Self Care | Payer: Self-pay | Attending: Emergency Medicine | Admitting: Emergency Medicine

## 2016-04-01 ENCOUNTER — Emergency Department: Payer: Self-pay

## 2016-04-01 DIAGNOSIS — R079 Chest pain, unspecified: Secondary | ICD-10-CM

## 2016-04-01 DIAGNOSIS — M25512 Pain in left shoulder: Secondary | ICD-10-CM | POA: Insufficient documentation

## 2016-04-01 DIAGNOSIS — Z87891 Personal history of nicotine dependence: Secondary | ICD-10-CM | POA: Insufficient documentation

## 2016-04-01 DIAGNOSIS — R0789 Other chest pain: Secondary | ICD-10-CM | POA: Insufficient documentation

## 2016-04-01 DIAGNOSIS — Z79899 Other long term (current) drug therapy: Secondary | ICD-10-CM | POA: Insufficient documentation

## 2016-04-01 LAB — CBC WITH DIFFERENTIAL/PLATELET
BASOS PCT: 1 %
Basophils Absolute: 0.1 10*3/uL (ref 0–0.1)
EOS ABS: 0.3 10*3/uL (ref 0–0.7)
EOS PCT: 3 %
HCT: 36.4 % (ref 35.0–47.0)
Hemoglobin: 12.2 g/dL (ref 12.0–16.0)
LYMPHS ABS: 4.3 10*3/uL — AB (ref 1.0–3.6)
Lymphocytes Relative: 41 %
MCH: 26.6 pg (ref 26.0–34.0)
MCHC: 33.5 g/dL (ref 32.0–36.0)
MCV: 79.4 fL — ABNORMAL LOW (ref 80.0–100.0)
MONO ABS: 0.6 10*3/uL (ref 0.2–0.9)
MONOS PCT: 6 %
NEUTROS PCT: 49 %
Neutro Abs: 5.1 10*3/uL (ref 1.4–6.5)
PLATELETS: 259 10*3/uL (ref 150–440)
RBC: 4.59 MIL/uL (ref 3.80–5.20)
RDW: 16 % — AB (ref 11.5–14.5)
WBC: 10.3 10*3/uL (ref 3.6–11.0)

## 2016-04-01 LAB — COMPREHENSIVE METABOLIC PANEL
ALBUMIN: 4.1 g/dL (ref 3.5–5.0)
ALK PHOS: 64 U/L (ref 38–126)
ALT: 16 U/L (ref 14–54)
ANION GAP: 6 (ref 5–15)
AST: 19 U/L (ref 15–41)
BILIRUBIN TOTAL: 0.3 mg/dL (ref 0.3–1.2)
BUN: 16 mg/dL (ref 6–20)
CALCIUM: 9.3 mg/dL (ref 8.9–10.3)
CO2: 26 mmol/L (ref 22–32)
CREATININE: 0.91 mg/dL (ref 0.44–1.00)
Chloride: 107 mmol/L (ref 101–111)
GFR calc Af Amer: >60 mL/min (ref >60)
GFR calc non Af Amer: >60 mL/min (ref >60)
GLUCOSE: 145 mg/dL — AB (ref 65–99)
Potassium: 3.8 mmol/L (ref 3.5–5.1)
Sodium: 139 mmol/L (ref 135–145)
TOTAL PROTEIN: 7.6 g/dL (ref 6.5–8.1)

## 2016-04-01 LAB — TROPONIN I: Troponin I: <0.03 ng/mL (ref <0.03)

## 2016-04-01 LAB — FIBRIN DERIVATIVES D-DIMER (ARMC ONLY): FIBRIN DERIVATIVES D-DIMER (ARMC): 344 (ref 0–499)

## 2016-04-01 MED ORDER — ASPIRIN EC 325 MG PO TBEC
DELAYED_RELEASE_TABLET | ORAL | Status: AC
  Administered 2016-04-01: 325 mg
  Filled 2016-04-01: qty 1

## 2016-04-01 MED ORDER — ASPIRIN 81 MG PO CHEW
CHEWABLE_TABLET | ORAL | Status: AC
  Filled 2016-04-01: qty 4

## 2016-04-01 MED ORDER — ASPIRIN EC 325 MG PO TBEC: 325.0000 mg | DELAYED_RELEASE_TABLET | Freq: Once | ORAL | Status: DC

## 2016-04-01 NOTE — ED Provider Notes (Signed)
[facility] Emergency Department Provider Note  ____________________________________________  Time seen: Approximately 10:19 AM  I have reviewed the triage vital signs and the nursing notes.   HISTORY  Chief Complaint Chest Pain   HPI Carmen Cooley is a 50 y.o. female no significant past medical history who presents for evaluation of left-sided chest pain and shoulder pain. Patient reports that for the last 2 weeks she has been having intermittent episodes of left shoulder pain. The pain is exacerbated by movement of her shoulder. 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 She denies any trauma to her shoulder. She reports the pain fully resolves when she takes 1 BC powder but this recurred over the course of the last 2 weeks. She also endorses left-sided chest pain that she reports sometimes it is associated with the shoulder pain but other times is there independently. She describes the pain as a pressure on the left side but denies radiation of this pain, denies shortness of breath, dizziness, numbness or weakness of her extremities. This pain is also intermittent and currently she has mild 2/10 chest pressure on the left side of his chest. She reports that the pain is not pleuritic, she denies swelling or pain of her lower extremities, personal or family history of blood clots, exogenous hormones. She does endorse long distance travel on her truck on a daily basis including the last one yesterday. She reports that she is a former smoker and stopped 2 years ago. She denies drugs and endorses occasional alcohol. She denies nausea, vomiting, abdominal pain, melena. She denies family history of heart disease.  History reviewed. No pertinent past medical history.  There are no active problems to display for this patient.   Past Surgical History  Procedure  Laterality Date  . Brain tumor excision      benign  . Knee surgery    . Foot surgery      Current Outpatient Rx  Name  Route  Sig  Dispense  Refill  . Aspirin-Caffeine 845-65 MG PACK   Oral   Take 1 packet by mouth every 6 (six) hours as needed.         . Multiple Vitamin (MULTI-VITAMIN DAILY PO)   Oral   Take 1 tablet by mouth daily.         Marland Kitchen oxyCODONE-acetaminophen (PERCOCET/ROXICET) 5-325 MG per tablet   Oral   Take 2 tablets by mouth every 6 (six) hours as needed for pain.   12 tablet   0     Allergies Compazine and Contrast media  Family History  Problem Relation Age of Onset  . Diabetes Father   . Cancer Father   . Cancer Other     Social History Social History  Substance Use Topics  . Smoking status: Former Smoker -- 0.00 packs/day  . Smokeless tobacco: None  . Alcohol Use: Yes     Comment: occasional    Review of Systems  Constitutional: Negative for fever. Eyes: Negative for visual changes. ENT: Negative for sore throat. Cardiovascular: + chest pain. Respiratory: Negative for shortness of breath. Gastrointestinal: Negative for abdominal pain, vomiting or diarrhea. Genitourinary: Negative for dysuria. Musculoskeletal: Negative for back pain. + Left shoulder pain Skin: Negative for rash. Neurological: Negative for headaches, weakness or numbness.  ____________________________________________   PHYSICAL EXAM:  VITAL SIGNS: ED Triage Vitals  Enc Vitals Group     BP 04/01/16 0952 124/71 mmHg     Pulse Rate 04/01/16 0952 80     Resp 04/01/16 0952 18     Temp 04/01/16 0952 98.4 F (36.9 C)     Temp Source 04/01/16 0952 Oral     SpO2 04/01/16 0952 96 %     Weight 04/01/16 0952 233 lb (105.688 kg)     Height 04/01/16 0952 5\' 6"  (1.676 m)     Head Cir --      Peak Flow --      Pain Score 04/01/16 0953 5     Pain Loc --      Pain Edu? --      Excl. in [place]? --     Constitutional: Alert and oriented. Well appearing and in no apparent  distress. HEENT:      Head: Normocephalic and atraumatic.         Eyes: Conjunctivae are normal. Sclera is non-icteric. EOMI. PERRL      Mouth/Throat: Mucous membranes are moist.       Neck: Supple with no signs of meningismus. Cardiovascular: Regular rate and rhythm. No murmurs, gallops, or rubs. 2+ symmetrical distal pulses are present in all extremities. No JVD. Respiratory: Normal respiratory effort. Lungs are clear to auscultation bilaterally. No wheezes, crackles, or rhonchi.  Gastrointestinal: Soft, non tender, and non distended with positive bowel sounds. No rebound or guarding. Genitourinary: No suprapubic tenderness. No CVA tenderness. Musculoskeletal: Full range of motion of the left shoulder with no tenderness to palpation, no deformities. Nontender with normal range of motion in all extremities. No edema, cyanosis, or erythema of extremities. Neurologic: Normal speech and language. Face is symmetric. Moving all extremities. No gross focal neurologic deficits are appreciated. Skin: Skin is warm, dry and intact. No rash noted. Psychiatric: Mood and affect are normal. Speech and behavior are normal.  ____________________________________________   LABS (all labs ordered are listed, but only abnormal results are displayed)  Labs Reviewed  COMPREHENSIVE METABOLIC PANEL - Abnormal; Notable for the following:    Glucose, Bld 145 (*)    All other components within normal limits  CBC WITH DIFFERENTIAL/PLATELET - Abnormal; Notable for the following:    MCV 79.4 (*)    RDW 16.0 (*)    Lymphs Abs 4.3 (*)    All other components within normal limits  TROPONIN I  FIBRIN DERIVATIVES D-DIMER ([facility] ONLY)   ____________________________________________  EKG  Normal sinus rhythm, rate of 73, normal intervals, normal axis, no ST elevations or depressions, T-wave inversions only 3. Unchanged from prior ____________________________________________  RADIOLOGY  I, Rudene Re,  personally viewed and evaluated these images (plain radiographs) as part of my medical decision making, as well as reviewing the written report by the radiologist.  ____________________________________________   PROCEDURES  Procedure(s) performed: None Critical Care performed:  None ____________________________________________   INITIAL IMPRESSION / ASSESSMENT AND PLAN / ED COURSE  50 year old female with no significant past medical history presenting with 2 weeks of intermittent left-sided shoulder and chest pain. 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 Low suspicion for PE based on vital signs, the pain is not pleuritic, no swelling of her lower extremities, no personal family history of PE, and no exogenous hormones. However since patient drives a lot we'll send a d-dimer to rule out PE. We'll also get an EKG and troponin x 1 as patient is having pain for 2 weeks to rule out  ACS although story doesn't seem consistent with ACS patient's heart score is 3.   Pertinent labs & imaging results that were available during my care of the patient were reviewed by me and considered in my medical decision making (see chart for details).  ----------------------------------------- 1:13 PM on 04/01/2016 -----------------------------------------  Chest x-ray, troponin, d-dimer are reassuring. This is most likely musculoskeletal pain. We'll discharge patient home with supportive care, exercises for her shoulder, and referral to orthopedics if symptoms don't get better. Discussed return precautions with patient.   I discussed my evaluation of the patient's symptoms, my clinical impression, and my proposed outpatient treatment plan with patient. We have discussed anticipatory guidance, scheduled follow-up, and careful return precautions. The patient expresses understanding and is comfortable with the discharge plan. All patient's questions were  answered.    ____________________________________________   FINAL CLINICAL IMPRESSION(S) / ED DIAGNOSES  Final diagnoses:  Chest pain, unspecified chest pain type  Left shoulder pain      NEW MEDICATIONS STARTED DURING THIS VISIT:  Discharge Medication List as of 04/01/2016  1:15 PM       Note:  This document was prepared using Dragon voice recognition software and may include unintentional dictation errors.    Rudene Re, MD 04/01/16 1351

## 2016-04-01 NOTE — Discharge Instructions (Signed)

## 2016-04-01 NOTE — ED Notes (Signed)
Pt c/o pain in left side of chest and arm from the past 2 weeks, states pain is worse with movement and deep breathing.. Denies injury.. States improved with BC powder.Marland Kitchen

## 2018-11-02 ENCOUNTER — Ambulatory Visit (INDEPENDENT_AMBULATORY_CARE_PROVIDER_SITE_OTHER): Payer: BLUE CROSS/BLUE SHIELD | Admitting: Internal Medicine

## 2018-11-02 ENCOUNTER — Other Ambulatory Visit: Payer: Self-pay

## 2018-11-02 ENCOUNTER — Encounter: Payer: Self-pay | Admitting: Internal Medicine

## 2018-11-02 ENCOUNTER — Ambulatory Visit (INDEPENDENT_AMBULATORY_CARE_PROVIDER_SITE_OTHER): Payer: BLUE CROSS/BLUE SHIELD

## 2018-11-02 VITALS — BP 128/70 | HR 93 | Temp 98.4°F | Ht 66.0 in | Wt 231.1 lb

## 2018-11-02 DIAGNOSIS — E559 Vitamin D deficiency, unspecified: Secondary | ICD-10-CM

## 2018-11-02 DIAGNOSIS — G8929 Other chronic pain: Secondary | ICD-10-CM

## 2018-11-02 DIAGNOSIS — E611 Iron deficiency: Secondary | ICD-10-CM

## 2018-11-02 DIAGNOSIS — E049 Nontoxic goiter, unspecified: Secondary | ICD-10-CM

## 2018-11-02 DIAGNOSIS — R05 Cough: Secondary | ICD-10-CM | POA: Diagnosis not present

## 2018-11-02 DIAGNOSIS — R232 Flushing: Secondary | ICD-10-CM

## 2018-11-02 DIAGNOSIS — Z0000 Encounter for general adult medical examination without abnormal findings: Secondary | ICD-10-CM

## 2018-11-02 DIAGNOSIS — M25512 Pain in left shoulder: Secondary | ICD-10-CM | POA: Diagnosis not present

## 2018-11-02 DIAGNOSIS — Z113 Encounter for screening for infections with a predominantly sexual mode of transmission: Secondary | ICD-10-CM

## 2018-11-02 DIAGNOSIS — Z1231 Encounter for screening mammogram for malignant neoplasm of breast: Secondary | ICD-10-CM

## 2018-11-02 DIAGNOSIS — Z1329 Encounter for screening for other suspected endocrine disorder: Secondary | ICD-10-CM

## 2018-11-02 DIAGNOSIS — Z1211 Encounter for screening for malignant neoplasm of colon: Secondary | ICD-10-CM

## 2018-11-02 DIAGNOSIS — Z720 Tobacco use: Secondary | ICD-10-CM

## 2018-11-02 DIAGNOSIS — R059 Cough, unspecified: Secondary | ICD-10-CM

## 2018-11-02 DIAGNOSIS — Z13818 Encounter for screening for other digestive system disorders: Secondary | ICD-10-CM

## 2018-11-02 DIAGNOSIS — Z1389 Encounter for screening for other disorder: Secondary | ICD-10-CM

## 2018-11-02 DIAGNOSIS — M549 Dorsalgia, unspecified: Secondary | ICD-10-CM | POA: Insufficient documentation

## 2018-11-02 DIAGNOSIS — Z13220 Encounter for screening for lipoid disorders: Secondary | ICD-10-CM

## 2018-11-02 NOTE — Progress Notes (Signed)
Chief Complaint  Patient presents with  . Establish Care   New patient  1. LUB pain/ Left shoulder pain x 1-2 years nothing tried h/o rotator cuff issues, ROM not limited  2. ? Goiter front of neck increasing in size  3. Tobacco abuse 1ppd cig with cough today on exam could be start of URI r/o COPD cough with clear phelgm no wheezing or sob nothing tried going to get mucinex today 4. Hot flashes  Review of Systems  Constitutional: Negative for weight loss.  HENT: Negative for hearing loss.   Eyes: Negative for blurred vision.  Respiratory: Negative for shortness of breath.   Cardiovascular: Negative for chest pain.  Gastrointestinal: Negative for abdominal pain.  Genitourinary:       +hot flashes   Musculoskeletal: Positive for back pain.  Skin: Negative for rash.  Neurological: Negative for headaches.  Psychiatric/Behavioral: Negative for depression and memory loss.   No past medical history on file. Past Surgical History:  Procedure Laterality Date  . BRAIN TUMOR EXCISION     benign  . FOOT SURGERY    . KNEE SURGERY     Family History  Problem Relation Age of Onset  . Diabetes Father   . Cancer Father   . Cancer Other    Social History   Socioeconomic History  . Marital status: Single    Spouse name: Not on file  . Number of children: Not on file  . Years of education: Not on file  . Highest education level: Not on file  Occupational History  . Not on file  Social Needs  . Financial resource strain: Not on file  . Food insecurity:    Worry: Not on file    Inability: Not on file  . Transportation needs:    Medical: Not on file    Non-medical: Not on file  Tobacco Use  . Smoking status: Former Smoker    Packs/day: 0.00  Substance and Sexual Activity  . Alcohol use: Yes    Comment: occasional  . Drug use: No  . Sexual activity: Not on file  Lifestyle  . Physical activity:    Days per week: Not on file    Minutes per session: Not on file  . Stress:  Not on file  Relationships  . Social connections:    Talks on phone: Not on file    Gets together: Not on file    Attends religious service: Not on file    Active member of club or organization: Not on file    Attends meetings of clubs or organizations: Not on file    Relationship status: Not on file  . Intimate partner violence:    Fear of current or ex partner: Not on file    Emotionally abused: Not on file    Physically abused: Not on file    Forced sexual activity: Not on file  Other Topics Concern  . Not on file  Social History Narrative  . Not on file   No outpatient medications have been marked as taking for the 11/02/18 encounter (Office Visit) with McLean-Scocuzza, Nino Glow, MD.   Allergies  Allergen Reactions  . Compazine [Prochlorperazine] Shortness Of Breath  . Contrast Media [Iodinated Diagnostic Agents] Hives   No results found for this or any previous visit (from the past 2160 hour(s)). Objective  Body mass index is 37.3 kg/m. Wt Readings from Last 3 Encounters:  11/02/18 231 lb 1.9 oz (104.8 kg)  04/01/16 233 lb (105.7  kg)  06/10/15 200 lb (90.7 kg)   Temp Readings from Last 3 Encounters:  11/02/18 98.4 F (36.9 C) (Oral)  04/01/16 98.1 F (36.7 C) (Oral)  06/10/15 98.1 F (36.7 C) (Oral)   BP Readings from Last 3 Encounters:  11/02/18 128/70  04/01/16 117/75  06/10/15 (!) 101/59   Pulse Readings from Last 3 Encounters:  11/02/18 93  04/01/16 80  06/10/15 82    Physical Exam Vitals signs and nursing note reviewed.  Constitutional:      Appearance: Normal appearance. She is well-developed and well-groomed. She is obese.  HENT:     Head: Normocephalic and atraumatic.     Nose: Nose normal.     Mouth/Throat:     Mouth: Mucous membranes are moist.     Pharynx: Oropharynx is clear.  Eyes:     Conjunctiva/sclera: Conjunctivae normal.     Pupils: Pupils are equal, round, and reactive to light.  Cardiovascular:     Rate and Rhythm: Normal rate  and regular rhythm.     Heart sounds: Normal heart sounds.  Pulmonary:     Effort: Pulmonary effort is normal.     Breath sounds: Normal breath sounds.  Musculoskeletal:       Arms:     Comments: Pain with ROM mild    Skin:    General: Skin is warm and dry.  Neurological:     General: No focal deficit present.     Mental Status: She is alert.     Gait: Gait normal.  Psychiatric:        Attention and Perception: Attention and perception normal.        Mood and Affect: Mood and affect normal.        Speech: Speech normal.        Behavior: Behavior normal. Behavior is cooperative.        Thought Content: Thought content normal.        Cognition and Memory: Cognition and memory normal.        Judgment: Judgment normal.     Assessment   1. LUB pain r/o pneumonia, rotator cuff h/o rotator cuff issues  2. Left shoulder pain  3. ? Goiter  4. Tobacco abuse with cough today on exam could be start of URI r/o COPD cough with clear phelgm no wheezing or sob nothing tried  5. Hot flashes likely related menopause  6. HM Plan   1 CXR and left shoulder Xray today  2. See above  3. Thyroid US 4.rec smoking cessation CXR today  5. Wants to try cold packs/fans for now  No hormone tx due to smoking  Consider effexor in future  6.  Declines flu shot  Tdap per pt had in 2014  Consider pna 23, shingrix in future   Mammogram referred  Colonoscopy referred  Pap get record [place] in [place] signed release today  Smoker rec smoking cessation on and off since age 63 max 1 ppd now 1ppd FH lung cancer   Eye Dr. Gloriann Loan   Provider: Dr. Olivia Mackie McLean-Scocuzza-Internal Medicine

## 2018-11-02 NOTE — Progress Notes (Signed)
Pre visit review using our clinic review tool, if applicable. No additional management support is needed unless otherwise documented below in the visit note. 

## 2018-11-02 NOTE — Patient Instructions (Addendum)
Tylenol 500 mg up to 6 pills per day  Salonpas or Aspercream or Bengay  Heat   Robitussin DM or Mucinex  Tea honey/lemon/ginger  Cough drops  Increase water  Rest   Menopause Menopause is the normal time of life when menstrual periods stop completely. It is usually confirmed by 12 months without a menstrual period. The transition to menopause (perimenopause) most often happens between the ages of 27 and 36. During perimenopause, hormone levels change in your body, which can cause symptoms and affect your health. Menopause may increase your risk for:  Loss of bone (osteoporosis), which causes bone breaks (fractures).  Depression.  Hardening and narrowing of the arteries (atherosclerosis), which can cause heart attacks and strokes. What are the causes? This condition is usually caused by a natural change in hormone levels that happens as you get older. The condition may also be caused by surgery to remove both ovaries (bilateral oophorectomy). What increases the risk? This condition is more likely to start at an earlier age if you have certain medical conditions or treatments, including:  A tumor of the pituitary gland in the brain.  A disease that affects the ovaries and hormone production.  Radiation treatment for cancer.  Certain cancer treatments, such as chemotherapy or hormone (anti-estrogen) therapy.  Heavy smoking and excessive alcohol use.  Family history of early menopause. This condition is also more likely to develop earlier in women who are very thin. What are the signs or symptoms? Symptoms of this condition include:  Hot flashes.  Irregular menstrual periods.  Night sweats.  Changes in feelings about sex. This could be a decrease in sex drive or an increased comfort around your sexuality.  Vaginal dryness and thinning of the vaginal walls. This may cause painful intercourse.  Dryness of the skin and development of wrinkles.  Headaches.  Problems  sleeping (insomnia).  Mood swings or irritability.  Memory problems.  Weight gain.  Hair growth on the face and chest.  Bladder infections or problems with urinating. How is this diagnosed? This condition is diagnosed based on your medical history, a physical exam, your age, your menstrual history, and your symptoms. Hormone tests may also be done. How is this treated? In some cases, no treatment is needed. You and your health care provider should make a decision together about whether treatment is necessary. Treatment will be based on your individual condition and preferences. Treatment for this condition focuses on managing symptoms. Treatment may include:  Menopausal hormone therapy (MHT).  Medicines to treat specific symptoms or complications.  Acupuncture.  Vitamin or herbal supplements. Before starting treatment, make sure to let your health care provider know if you have a personal or family history of:  Heart disease.  Breast cancer.  Blood clots.  Diabetes.  Osteoporosis. Follow these instructions at home: Lifestyle  Do not use any products that contain nicotine or tobacco, such as cigarettes and e-cigarettes. If you need help quitting, ask your health care provider.  Get at least 30 minutes of physical activity on 5 or more days each week.  Avoid alcoholic and caffeinated beverages, as well as spicy foods. This may help prevent hot flashes.  Get 7-8 hours of sleep each night.  If you have hot flashes, try: ? Dressing in layers. ? Avoiding things that may trigger hot flashes, such as spicy food, warm places, or stress. ? Taking slow, deep breaths when a hot flash starts. ? Keeping a fan in your home and office.  Find ways  to manage stress, such as deep breathing, meditation, or journaling.  Consider going to group therapy with other women who are having menopause symptoms. Ask your health care provider about recommended group therapy meetings. Eating and  drinking  Eat a healthy, balanced diet that contains whole grains, lean protein, low-fat dairy, and plenty of fruits and vegetables.  Your health care provider may recommend adding more soy to your diet. Foods that contain soy include tofu, tempeh, and soy milk.  Eat plenty of foods that contain calcium and vitamin D for bone health. Items that are rich in calcium include low-fat milk, yogurt, beans, almonds, sardines, broccoli, and kale. Medicines  Take over-the-counter and prescription medicines only as told by your health care provider.  Talk with your health care provider before starting any herbal supplements. If prescribed, take vitamins and supplements as told by your health care provider. These may include: ? Calcium. Women age 69 and older should get 1,200 mg (milligrams) of calcium every day. ? Vitamin D. Women need 600-800 International Units of vitamin D each day. ? Vitamins B12 and B6. Aim for 50 micrograms of B12 and 1.5 mg of B6 each day. General instructions  Keep track of your menstrual periods, including: ? When they occur. ? How heavy they are and how long they last. ? How much time passes between periods.  Keep track of your symptoms, noting when they start, how often you have them, and how long they last.  Use vaginal lubricants or moisturizers to help with vaginal dryness and improve comfort during sex.  Keep all follow-up visits as told by your health care provider. This is important. This includes any group therapy or counseling. Contact a health care provider if:  You are still having menstrual periods after age 75.  You have pain during sex.  You have not had a period for 12 months and you develop vaginal bleeding. Get help right away if:  You have: ? Severe depression. ? Excessive vaginal bleeding. ? Pain when you urinate. ? A fast or irregular heart beat (palpitations). ? Severe headaches. ? Abdomen (abdominal) pain or severe indigestion.  You  fell and you think you have a broken bone.  You develop leg or chest pain.  You develop vision problems.  You feel a lump in your breast. Summary  Menopause is the normal time of life when menstrual periods stop completely. It is usually confirmed by 12 months without a menstrual period.  The transition to menopause (perimenopause) most often happens between the ages of 54 and 34.  Symptoms can be managed through medicines, lifestyle changes, and complementary therapies such as acupuncture.  Eat a balanced diet that is rich in nutrients to promote bone health and heart health and to manage symptoms during menopause. This information is not intended to replace advice given to you by your health care provider. Make sure you discuss any questions you have with your health care provider. Document Released: 12/05/2003 Document Revised: 10/17/2016 Document Reviewed: 10/17/2016 Elsevier Interactive Patient Education  2019 Else[employer]    Shoulder Impingement Syndrome  Shoulder impingement syndrome is a condition that causes pain when connective tissues (tendons) surrounding the shoulder joint become pinched. These tendons are part of the group of muscles and tissues that help to stabilize the shoulder (rotator cuff). Beneath the rotator cuff is a fluid-filled sac (bursa) that allows the muscles and tendons to glide smoothly. The bursa may become swollen or irritated (bursitis). Bursitis, swelling in the rotator cuff tendons,  or both conditions can decrease how much space is under a bone in the shoulder joint (acromion), resulting in impingement. What are the causes? Shoulder impingement syndrome may be caused by bursitis or swelling of the rotator cuff tendons, which may result from:  Repetitive overhead arm movements.  Falling onto the shoulder.  Weakness in the shoulder muscles. What increases the risk? You may be more likely to develop this condition if you:  Play sports that  involve throwing, such as baseball.  Participate in sports such as tennis, volleyball, and swimming.  Work as a Curator, Games developer, or Architect. Some people are also more likely to develop impingement syndrome because of the shape of their acromion bone. What are the signs or symptoms? The main symptom of this condition is pain on the front or side of the shoulder. The pain may:  Get worse when lifting or raising the arm.  Get worse at night.  Wake you up from sleeping.  Feel sharp when the shoulder is moved and then fade to an ache. Other symptoms may include:  Tenderness.  Stiffness.  Inability to raise the arm above shoulder level or behind the body.  Weakness. How is this diagnosed? This condition may be diagnosed based on:  Your symptoms and medical history.  A physical exam.  Imaging tests, such as: ? X-rays. ? MRI. ? Ultrasound. How is this treated? This condition may be treated by:  Resting your shoulder and avoiding all activities that cause pain or put stress on the shoulder.  Icing your shoulder.  NSAIDs to help reduce pain and swelling.  One or more injections of medicines to numb the area and reduce inflammation.  Physical therapy.  Surgery. This may be needed if nonsurgical treatments have not helped. Surgery may involve repairing the rotator cuff, reshaping the acromion, or removing the bursa. Follow these instructions at home: Managing pain, stiffness, and swelling   If directed, put ice on the injured area. ? Put ice in a plastic bag. ? Place a towel between your skin and the bag. ? Leave the ice on for 20 minutes, 2-3 times a day. Activity  Rest and return to your normal activities as told by your health care provider. Ask your health care provider what activities are safe for you.  Do exercises as told by your health care provider. General instructions  Do not use any products that contain nicotine or tobacco, such as cigarettes,  e-cigarettes, and chewing tobacco. These can delay healing. If you need help quitting, ask your health care provider.  Ask your health care provider when it is safe for you to drive.  Take over-the-counter and prescription medicines only as told by your health care provider.  Keep all follow-up visits as told by your health care provider. This is important. How is this prevented?  Give your body time to rest between periods of activity.  Be safe and responsible while being active. This will help you avoid falls.  Maintain physical fitness, including strength and flexibility. Contact a health care provider if:  Your symptoms have not improved after 1-2 months of treatment and rest.  You cannot lift your arm away from your body. Summary  Shoulder impingement syndrome is a condition that causes pain when connective tissues (tendons) surrounding the shoulder joint become pinched.  The main symptom of this condition is pain on the front or side of the shoulder.  This condition is usually treated with rest, ice, and pain medicines as needed. This  information is not intended to replace advice given to you by your health care provider. Make sure you discuss any questions you have with your health care provider. Document Released: 09/14/2005 Document Revised: 03/09/2018 Document Reviewed: 03/09/2018 Elsevier Interactive Patient Education  [employer].

## 2018-11-02 NOTE — Progress Notes (Signed)
Unable to leave message for patient to return call back for xray results, home number is wrong and vm is full.  [facility] may give results and obtain information.

## 2018-11-03 ENCOUNTER — Other Ambulatory Visit: Payer: Self-pay | Admitting: Internal Medicine

## 2018-11-03 ENCOUNTER — Encounter: Payer: Self-pay | Admitting: Internal Medicine

## 2018-11-03 ENCOUNTER — Other Ambulatory Visit (INDEPENDENT_AMBULATORY_CARE_PROVIDER_SITE_OTHER): Payer: BLUE CROSS/BLUE SHIELD

## 2018-11-03 DIAGNOSIS — E559 Vitamin D deficiency, unspecified: Secondary | ICD-10-CM | POA: Insufficient documentation

## 2018-11-03 DIAGNOSIS — R232 Flushing: Secondary | ICD-10-CM | POA: Diagnosis not present

## 2018-11-03 DIAGNOSIS — Z1329 Encounter for screening for other suspected endocrine disorder: Secondary | ICD-10-CM

## 2018-11-03 DIAGNOSIS — E611 Iron deficiency: Secondary | ICD-10-CM

## 2018-11-03 DIAGNOSIS — Z13220 Encounter for screening for lipoid disorders: Secondary | ICD-10-CM

## 2018-11-03 DIAGNOSIS — Z13818 Encounter for screening for other digestive system disorders: Secondary | ICD-10-CM

## 2018-11-03 DIAGNOSIS — Z113 Encounter for screening for infections with a predominantly sexual mode of transmission: Secondary | ICD-10-CM

## 2018-11-03 DIAGNOSIS — Z0000 Encounter for general adult medical examination without abnormal findings: Secondary | ICD-10-CM | POA: Diagnosis not present

## 2018-11-03 DIAGNOSIS — Z1389 Encounter for screening for other disorder: Secondary | ICD-10-CM

## 2018-11-03 LAB — CBC WITH DIFFERENTIAL/PLATELET
BASOS PCT: 0.6 % (ref 0.0–3.0)
Basophils Absolute: 0.1 10*3/uL (ref 0.0–0.1)
Eosinophils Absolute: 0.5 10*3/uL (ref 0.0–0.7)
Eosinophils Relative: 4.9 % (ref 0.0–5.0)
HEMATOCRIT: 37.9 % (ref 36.0–46.0)
Hemoglobin: 12.5 g/dL (ref 12.0–15.0)
LYMPHS PCT: 35 % (ref 12.0–46.0)
Lymphs Abs: 3.8 10*3/uL (ref 0.7–4.0)
MCHC: 33 g/dL (ref 30.0–36.0)
MCV: 81.4 fl (ref 78.0–100.0)
Monocytes Absolute: 0.8 10*3/uL (ref 0.1–1.0)
Monocytes Relative: 7.4 % (ref 3.0–12.0)
Neutro Abs: 5.7 10*3/uL (ref 1.4–7.7)
Neutrophils Relative %: 52.1 % (ref 43.0–77.0)
Platelets: 243 10*3/uL (ref 150.0–400.0)
RBC: 4.66 Mil/uL (ref 3.87–5.11)
RDW: 15.1 % (ref 11.5–15.5)
WBC: 10.9 10*3/uL — ABNORMAL HIGH (ref 4.0–10.5)

## 2018-11-03 LAB — COMPREHENSIVE METABOLIC PANEL
ALK PHOS: 70 U/L (ref 39–117)
ALT: 14 U/L (ref 0–35)
AST: 12 U/L (ref 0–37)
Albumin: 4.2 g/dL (ref 3.5–5.2)
BUN: 8 mg/dL (ref 6–23)
CALCIUM: 9 mg/dL (ref 8.4–10.5)
CO2: 30 mEq/L (ref 19–32)
Chloride: 107 mEq/L (ref 96–112)
Creatinine, Ser: 0.91 mg/dL (ref 0.40–1.20)
GFR: 78.19 mL/min (ref >60.00)
Glucose, Bld: 99 mg/dL (ref 70–99)
Potassium: 3 mEq/L — ABNORMAL LOW (ref 3.5–5.1)
Sodium: 145 mEq/L (ref 135–145)
Total Bilirubin: 0.4 mg/dL (ref 0.2–1.2)
Total Protein: 7.4 g/dL (ref 6.0–8.3)

## 2018-11-03 LAB — LIPID PANEL
CHOLESTEROL: 136 mg/dL (ref 0–200)
HDL: 33.9 mg/dL — ABNORMAL LOW (ref >39.00)
LDL Cholesterol: 68 mg/dL (ref 0–99)
NonHDL: 101.94
Total CHOL/HDL Ratio: 4
Triglycerides: 171 mg/dL — ABNORMAL HIGH (ref 0.0–149.0)
VLDL: 34.2 mg/dL (ref 0.0–40.0)

## 2018-11-03 LAB — VITAMIN D 25 HYDROXY (VIT D DEFICIENCY, FRACTURES): VITD: <7 ng/mL — ABNORMAL LOW (ref 30.00–100.00)

## 2018-11-03 LAB — FOLLICLE STIMULATING HORMONE: FSH: 94 m[IU]/mL

## 2018-11-03 LAB — TSH: TSH: 0.33 u[IU]/mL — AB (ref 0.35–4.50)

## 2018-11-03 LAB — T4, FREE: Free T4: 0.75 ng/dL (ref 0.60–1.60)

## 2018-11-03 MED ORDER — CHOLECALCIFEROL 1.25 MG (50000 UT) PO CAPS: 50000.0000 [IU] | ORAL_CAPSULE | ORAL | 3 refills | Status: DC

## 2018-11-03 NOTE — Addendum Note (Signed)
Addended by: Leeanne Rio on: 11/03/2018 02:22 PM   Modules accepted: Orders

## 2018-11-04 LAB — URINALYSIS, ROUTINE W REFLEX MICROSCOPIC
Bilirubin, UA: NEGATIVE
Glucose, UA: NEGATIVE
KETONES UA: NEGATIVE
Leukocytes, UA: NEGATIVE
NITRITE UA: NEGATIVE
Protein, UA: NEGATIVE
RBC, UA: NEGATIVE
Specific Gravity, UA: 1.021 (ref 1.005–1.030)
Urobilinogen, Ur: 1 mg/dL (ref 0.2–1.0)
pH, UA: 7.5 (ref 5.0–7.5)

## 2018-11-04 LAB — HIV ANTIBODY (ROUTINE TESTING W REFLEX): HIV 1&2 Ab, 4th Generation: NONREACTIVE

## 2018-11-04 LAB — HEPATITIS C ANTIBODY
Hepatitis C Ab: NONREACTIVE
SIGNAL TO CUT-OFF: 0.02 (ref <1.00)

## 2018-11-04 LAB — IRON,TIBC AND FERRITIN PANEL
%SAT: 14 % (calc) — ABNORMAL LOW (ref 16–45)
Ferritin: 108 ng/mL (ref 16–232)
Iron: 47 ug/dL (ref 45–160)
TIBC: 339 mcg/dL (calc) (ref 250–450)

## 2018-11-04 LAB — HEPATITIS B SURFACE ANTIGEN: Hepatitis B Surface Ag: NONREACTIVE

## 2018-11-04 LAB — HEPATITIS B SURFACE ANTIBODY, QUANTITATIVE: Hep B S AB Quant (Post): <5 m[IU]/mL — ABNORMAL LOW (ref >=10)

## 2018-11-15 ENCOUNTER — Other Ambulatory Visit: Payer: Self-pay

## 2018-11-15 DIAGNOSIS — Z1211 Encounter for screening for malignant neoplasm of colon: Secondary | ICD-10-CM

## 2018-11-16 ENCOUNTER — Encounter
Admission: RE | Disposition: A | Discharge status: Home / Self Care | Payer: Self-pay | Source: Home / Self Care | Attending: Gastroenterology

## 2018-11-16 ENCOUNTER — Ambulatory Visit: Payer: BLUE CROSS/BLUE SHIELD | Admitting: Certified Registered Nurse Anesthetist

## 2018-11-16 ENCOUNTER — Encounter: Payer: Self-pay | Admitting: Emergency Medicine

## 2018-11-16 ENCOUNTER — Ambulatory Visit
Admission: RE | Admit: 2018-11-16 | Discharge: 2018-11-16 | Disposition: A | Discharge status: Home / Self Care | Payer: BLUE CROSS/BLUE SHIELD | Attending: Gastroenterology | Admitting: Gastroenterology

## 2018-11-16 DIAGNOSIS — Z79899 Other long term (current) drug therapy: Secondary | ICD-10-CM | POA: Insufficient documentation

## 2018-11-16 DIAGNOSIS — K621 Rectal polyp: Secondary | ICD-10-CM | POA: Insufficient documentation

## 2018-11-16 DIAGNOSIS — F17210 Nicotine dependence, cigarettes, uncomplicated: Secondary | ICD-10-CM | POA: Diagnosis not present

## 2018-11-16 DIAGNOSIS — Z1211 Encounter for screening for malignant neoplasm of colon: Secondary | ICD-10-CM | POA: Insufficient documentation

## 2018-11-16 HISTORY — PX: COLONOSCOPY WITH PROPOFOL: SHX5780

## 2018-11-16 SURGERY — COLONOSCOPY WITH PROPOFOL
Anesthesia: General

## 2018-11-16 MED ORDER — PROPOFOL 10 MG/ML IV BOLUS
INTRAVENOUS | Status: DC | PRN
  Administered 2018-11-16: 40 mg via INTRAVENOUS
  Administered 2018-11-16: 20 mg via INTRAVENOUS

## 2018-11-16 MED ORDER — LIDOCAINE HCL (CARDIAC) PF 100 MG/5ML IV SOSY
PREFILLED_SYRINGE | INTRAVENOUS | Status: DC | PRN
  Administered 2018-11-16: 50 mg via INTRAVENOUS

## 2018-11-16 MED ORDER — SODIUM CHLORIDE 0.9 % IV SOLN
INTRAVENOUS | Status: DC | PRN
  Administered 2018-11-16: 13:00:00 via INTRAVENOUS

## 2018-11-16 MED ORDER — PROPOFOL 10 MG/ML IV BOLUS
INTRAVENOUS | Status: AC
  Filled 2018-11-16: qty 20

## 2018-11-16 MED ORDER — PROPOFOL 500 MG/50ML IV EMUL
INTRAVENOUS | Status: AC
  Filled 2018-11-16: qty 50

## 2018-11-16 MED ORDER — SODIUM CHLORIDE 0.9 % IV SOLN
INTRAVENOUS | Status: DC
  Administered 2018-11-16: 1000 mL via INTRAVENOUS

## 2018-11-16 MED ORDER — PROPOFOL 500 MG/50ML IV EMUL
INTRAVENOUS | Status: DC | PRN
  Administered 2018-11-16: 100 ug/kg/min via INTRAVENOUS

## 2018-11-16 NOTE — Anesthesia Preprocedure Evaluation (Addendum)
Anesthesia Evaluation  Patient identified by MRN, date of birth, ID band Patient awake    Reviewed: Allergy & Precautions, H&P , NPO status , reviewed documented beta blocker date and time   Airway Mallampati: II  TM Distance: >3 FB Neck ROM: full    Dental  (+) Edentulous Upper, Edentulous Lower   Pulmonary Current Smoker,    Pulmonary exam normal        Cardiovascular Normal cardiovascular exam     Neuro/Psych  Headaches, Seizures -,  No seizures since 1981, no meds    GI/Hepatic   Endo/Other  Goiter   Renal/GU      Musculoskeletal   Abdominal   Peds  Hematology   Anesthesia Other Findings Past Medical History: No date: Allergy No date: History of blood transfusion No date: Migraines No date: Seizure ([place]) No date: UTI (urinary tract infection)  Past Surgical History: No date: BRAIN TUMOR EXCISION     Comment:  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  No date: CESAREAN SECTION     Comment:  x 1 No date: FOOT SURGERY No date: KNEE SURGERY     Reproductive/Obstetrics                           Anesthesia Physical Anesthesia Plan  ASA: II  Anesthesia Plan: General   Post-op Pain Management:    Induction: Intravenous  PONV Risk Score and Plan: 2 and Treatment may vary due to age or medical condition and TIVA  Airway Management Planned: Nasal Cannula and Natural Airway  Additional Equipment:   Intra-op Plan:   Post-operative Plan:   Informed Consent: I have reviewed the patients History and Physical, chart, labs and discussed the procedure including the risks, benefits and alternatives for the proposed anesthesia with the patient or authorized representative who has indicated his/her understanding and acceptance.     Dental Advisory Given  Plan Discussed with: CRNA  Anesthesia Plan Comments:         Anesthesia Quick Evaluation

## 2018-11-16 NOTE — Anesthesia Post-op Follow-up Note (Signed)
Anesthesia QCDR form completed.        

## 2018-11-16 NOTE — H&P (Signed)
Jonathon Bellows, MD [address], [place], [place], [place], [address] [id number], [place], [place], [place], [address] Phone: [phone]  Fax: [phone]  Primary Care Physician:  McLean-Scocuzza, Nino Glow, MD   Pre-Procedure History & Physical: HPI:  Carmen Cooley is a 53 y.o. female is here for an colonoscopy.   Past Medical History:  Diagnosis Date  . Allergy   . History of blood transfusion   . Migraines   . Seizure ([place])   . UTI (urinary tract infection)     Past Surgical History:  Procedure Laterality Date  . 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 CESAREAN SECTION     x 1  . FOOT SURGERY    . KNEE SURGERY      Prior to Admission medications   Medication Sig Start Date End Date Taking? Authorizing Provider  Cholecalciferol 1.25 MG (50000 UT) capsule Take 1 capsule (50,000 Units total) by mouth once a week. 11/03/18  Yes McLean-Scocuzza, Nino Glow, MD    Allergies as of 11/02/2018 - Review Complete 11/02/2018  Allergen Reaction Noted  . Compazine [prochlorperazine] Shortness Of Breath 06/27/2013  . Contrast media [iodinated diagnostic agents] Hives 06/27/2013    Family History  Problem Relation Age of Onset  . Diabetes Father   . Cancer Father        sinus   . Hypertension Father   . Cancer Other   . Cancer Mother        pancreatic/whipple/chemo  . Hypertension Mother   . Cancer Maternal Grandmother        lung smoker  . Cancer Paternal Grandmother        elbow per pt     Social History   Socioeconomic History  . Marital status: Single    Spouse name: Not on file  . Number of children: Not on file  . Years of education: Not on file  . Highest education level: Not on file  Occupational History  . Not on file  Social Needs  . Financial resource strain: Not on file  . Food insecurity:    Worry: Not on file    Inability: Not on file  . Transportation needs:    Medical: Not on file    Non-medical: Not on file    Tobacco Use  . Smoking status: Current Every Day Smoker    Packs/day: 0.50  . Smokeless tobacco: Never Used  Substance and Sexual Activity  . Alcohol use: Yes    Comment: occasional  . Drug use: No  . Sexual activity: Not Currently  Lifestyle  . Physical activity:    Days per week: Not on file    Minutes per session: Not on file  . Stress: Not on file  Relationships  . Social connections:    Talks on phone: Not on file    Gets together: Not on file    Attends religious service: Not on file    Active member of club or organization: Not on file    Attends meetings of clubs or organizations: Not on file    Relationship status: Not on file  . Intimate partner violence:    Fear of current or ex partner: Not on file    Emotionally abused: Not on file    Physically abused: Not on file    Forced sexual activity: Not on file  Other Topics Concern  . Not on file  Social History  Narrative   4 kids    Single    Some college, truck driver    No guns    Wears seat belt    Safe in relationship    Review of Systems: See HPI, otherwise negative ROS  Physical Exam: BP 120/75   Pulse 71   Temp 98.5 F (36.9 C) (Oral)   Resp 16   Ht 5\' 6"  (1.676 m)   Wt 104.8 kg   SpO2 99%   BMI 37.28 kg/m  General:   Alert,  pleasant and cooperative in NAD Head:  Normocephalic and atraumatic. Neck:  Supple; no masses or thyromegaly. Lungs:  Clear throughout to auscultation, normal respiratory effort.    Heart:  +S1, +S2, Regular rate and rhythm, No edema. Abdomen:  Soft, nontender and nondistended. Normal bowel sounds, without guarding, and without rebound.   Neurologic:  Alert and  oriented x4;  grossly normal neurologically.  Impression/Plan: Carmen Cooley is here for an colonoscopy to be performed for Screening colonoscopy average risk   Risks, benefits, limitations, and alternatives regarding  colonoscopy have been reviewed with the patient.  Questions have been answered.  All parties  agreeable.   Jonathon Bellows, MD  11/16/2018, 12:54 PM

## 2018-11-16 NOTE — Transfer of Care (Signed)
Immediate Anesthesia Transfer of Care Note  Patient: Carmen Cooley  Procedure(s) Performed: COLONOSCOPY WITH PROPOFOL (N/A )  Patient Location: PACU  Anesthesia Type:General  Level of Consciousness: awake, alert  and oriented  Airway & Oxygen Therapy: Patient Spontanous Breathing and Patient connected to nasal cannula oxygen  Post-op Assessment: Report given to RN and Post -op Vital signs reviewed and stable  Post vital signs: Reviewed and stable  Last Vitals:  Vitals Value Taken Time  BP 100/71 11/16/2018  1:24 PM  Temp    Pulse 80 11/16/2018  1:25 PM  Resp 18 11/16/2018  1:25 PM  SpO2 97 % 11/16/2018  1:25 PM  Vitals shown include unvalidated device data.  Last Pain:  Vitals:   11/16/18 1324  TempSrc: (P) Tympanic  PainSc:          Complications: No apparent anesthesia complications

## 2018-11-16 NOTE — Op Note (Signed)
[facility] Patient Name: Carmen Cooley Procedure Date: 11/16/2018 12:55 PM MRN: [id number] Account #: [id number] Date of Birth: 1965-11-30 Admit Type: Outpatient Age: 53 Room: [facility] ENDO ROOM 1 Gender: Female Note Status: Finalized Procedure:            Colonoscopy Indications:          Screening for colorectal malignant neoplasm Providers:            Jonathon Bellows MD, MD Medicines:            Monitored Anesthesia Care Complications:        No immediate complications. Procedure:            Pre-Anesthesia Assessment:                       - Prior to the procedure, a History and Physical was                        performed, and patient medications, allergies and                        sensitivities were reviewed. The patient's tolerance of                        previous anesthesia was reviewed.                       - The risks and benefits of the procedure and the                        sedation options and risks were discussed with the                        patient. All questions were answered and informed                        consent was obtained.                       - ASA Grade Assessment: III - A patient with severe                        systemic disease.                       After obtaining informed consent, the colonoscope was                        passed under direct vision. Throughout the procedure,                        the patient's blood pressure, pulse, and oxygen                        saturations were monitored continuously. The                        Colonoscope was introduced through the anus and                        advanced to the the cecum, identified by the  appendiceal orifice, IC valve and transillumination.                        The colonoscopy was performed with ease. The patient                        tolerated the procedure well. The quality of the bowel                        preparation  was good. Findings:      The perianal and digital rectal examinations were normal.      Two sessile polyps were found in the rectum. The polyps were 3 to 4 mm       in size. These polyps were removed with a cold biopsy forceps. Resection       and retrieval were complete.      The exam was otherwise without abnormality on direct and retroflexion       views. Impression:           - Two 3 to 4 mm polyps in the rectum, removed with a                        cold biopsy forceps. Resected and retrieved.                       - The examination was otherwise normal on direct and                        retroflexion views. Recommendation:       - Discharge patient to home (with escort).                       - Resume previous diet.                       - Continue present medications.                       - Await pathology results.                       - Repeat colonoscopy for surveillance based on                        pathology results. Procedure Code(s):    --- Professional ---                       [phone], Colonoscopy, flexible; with biopsy, single or                        multiple Diagnosis Code(s):    --- Professional ---                       Z12.11, Encounter for screening for malignant neoplasm                        of colon                       K62.1, Rectal polyp CPT copyright 2018 American Medical Association. All rights reserved. The codes documented in this report are preliminary  and upon coder review may  be revised to meet current compliance requirements. Jonathon Bellows, MD Jonathon Bellows MD, MD 11/16/2018 1:22:41 PM This report has been signed electronically. Number of Addenda: 0 Note Initiated On: 11/16/2018 12:55 PM Scope Withdrawal Time: 0 hours 11 minutes 43 seconds  Total Procedure Duration: 0 hours 16 minutes 49 seconds       [facility]

## 2018-11-16 NOTE — Anesthesia Postprocedure Evaluation (Signed)
Anesthesia Post Note  Patient: Carmen Cooley  Procedure(s) Performed: COLONOSCOPY WITH PROPOFOL (N/A )  Patient location during evaluation: Endoscopy Anesthesia Type: General Level of consciousness: awake and alert Pain management: pain level controlled Vital Signs Assessment: post-procedure vital signs reviewed and stable Respiratory status: spontaneous breathing, nonlabored ventilation and respiratory function stable Cardiovascular status: blood pressure returned to baseline and stable Postop Assessment: no apparent nausea or vomiting Anesthetic complications: no     Last Vitals:  Vitals:   11/16/18 1344 11/16/18 1354  BP:  130/89  Pulse:    Resp: 17   Temp:    SpO2:      Last Pain:  Vitals:   11/16/18 1354  TempSrc:   PainSc: 0-No pain                 Alphonsus Sias

## 2018-11-17 ENCOUNTER — Encounter: Payer: Self-pay | Admitting: Gastroenterology

## 2018-11-17 LAB — SURGICAL PATHOLOGY

## 2018-11-20 ENCOUNTER — Encounter: Payer: Self-pay | Admitting: Gastroenterology

## 2018-11-23 ENCOUNTER — Ambulatory Visit
Admission: RE | Admit: 2018-11-23 | Discharge: 2018-11-23 | Disposition: A | Discharge status: Home / Self Care | Payer: BLUE CROSS/BLUE SHIELD | Source: Ambulatory Visit | Attending: Internal Medicine | Admitting: Internal Medicine

## 2018-11-23 DIAGNOSIS — E049 Nontoxic goiter, unspecified: Secondary | ICD-10-CM | POA: Diagnosis not present

## 2018-11-29 ENCOUNTER — Ambulatory Visit (INDEPENDENT_AMBULATORY_CARE_PROVIDER_SITE_OTHER): Payer: BLUE CROSS/BLUE SHIELD | Admitting: Internal Medicine

## 2018-11-29 ENCOUNTER — Encounter: Payer: Self-pay | Admitting: Internal Medicine

## 2018-11-29 VITALS — BP 100/66 | HR 85 | Temp 98.9°F | Ht 66.0 in | Wt 234.6 lb

## 2018-11-29 DIAGNOSIS — E559 Vitamin D deficiency, unspecified: Secondary | ICD-10-CM

## 2018-11-29 DIAGNOSIS — E611 Iron deficiency: Secondary | ICD-10-CM | POA: Diagnosis not present

## 2018-11-29 DIAGNOSIS — N3000 Acute cystitis without hematuria: Secondary | ICD-10-CM

## 2018-11-29 DIAGNOSIS — M545 Low back pain, unspecified: Secondary | ICD-10-CM

## 2018-11-29 DIAGNOSIS — E876 Hypokalemia: Secondary | ICD-10-CM

## 2018-11-29 DIAGNOSIS — R946 Abnormal results of thyroid function studies: Secondary | ICD-10-CM | POA: Diagnosis not present

## 2018-11-29 DIAGNOSIS — E041 Nontoxic single thyroid nodule: Secondary | ICD-10-CM

## 2018-11-29 DIAGNOSIS — N3941 Urge incontinence: Secondary | ICD-10-CM

## 2018-11-29 DIAGNOSIS — E785 Hyperlipidemia, unspecified: Secondary | ICD-10-CM

## 2018-11-29 LAB — T3, FREE: T3, Free: 3.8 pg/mL (ref 2.3–4.2)

## 2018-11-29 LAB — T4, FREE: Free T4: 0.76 ng/dL (ref 0.60–1.60)

## 2018-11-29 LAB — TSH: TSH: 1.06 u[IU]/mL (ref 0.35–4.50)

## 2018-11-29 MED ORDER — CIPROFLOXACIN HCL 500 MG PO TABS: 500.0000 mg | ORAL_TABLET | Freq: Two times a day (BID) | ORAL | 0 refills | Status: DC

## 2018-11-29 MED ORDER — INTEGRA 62.5-62.5-40-3 MG PO CAPS: 1.0000 | ORAL_CAPSULE | Freq: Every day | ORAL | 0 refills | Status: DC

## 2018-11-29 NOTE — Progress Notes (Signed)
Pre visit review using our clinic review tool, if applicable. No additional management support is needed unless otherwise documented below in the visit note. 

## 2018-11-29 NOTE — Patient Instructions (Addendum)
Food Choices for Gastroesophageal Reflux Disease, Adult When you have gastroesophageal reflux disease (GERD), the foods you eat and your eating habits are very important. Choosing the right foods can help ease the discomfort of GERD. Consider working with a diet and nutrition specialist (dietitian) to help you make healthy food choices. What general guidelines should I follow?  Eating plan  Choose healthy foods low in fat, such as fruits, vegetables, whole grains, low-fat dairy products, and lean meat, fish, and poultry.  Eat frequent, small meals instead of three large meals each day. Eat your meals slowly, in a relaxed setting. Avoid bending over or lying down until 2-3 hours after eating.  Limit high-fat foods such as fatty meats or fried foods.  Limit your intake of oils, butter, and shortening to less than 8 teaspoons each day.  Avoid the following: ? Foods that cause symptoms. These may be different for different people. Keep a food diary to keep track of foods that cause symptoms. ? Alcohol. ? Drinking large amounts of liquid with meals. ? Eating meals during the 2-3 hours before bed.  Cook foods using methods other than frying. This may include baking, grilling, or broiling. Lifestyle  Maintain a healthy weight. Ask your health care provider what weight is healthy for you. If you need to lose weight, work with your health care provider to do so safely.  Exercise for at least 30 minutes on 5 or more days each week, or as told by your health care provider.  Avoid wearing clothes that fit tightly around your waist and chest.  Do not use any products that contain nicotine or tobacco, such as cigarettes and e-cigarettes. If you need help quitting, ask your health care provider.  Sleep with the head of your bed raised. Use a wedge under the mattress or blocks under the bed frame to raise the head of the bed. What foods are not recommended? The items listed may not be a complete  list. Talk with your dietitian about what dietary choices are best for you. Grains Pastries or quick breads with added fat. French toast. Vegetables Deep fried vegetables. French fries. Any vegetables prepared with added fat. Any vegetables that cause symptoms. For some people this may include tomatoes and tomato products, chili peppers, onions and garlic, and horseradish. Fruits Any fruits prepared with added fat. Any fruits that cause symptoms. For some people this may include citrus fruits, such as oranges, grapefruit, pineapple, and lemons. Meats and other protein foods High-fat meats, such as fatty beef or pork, hot dogs, ribs, ham, sausage, salami and bacon. Fried meat or protein, including fried fish and fried chicken. Nuts and nut butters. Dairy Whole milk and chocolate milk. Sour cream. Cream. Ice cream. Cream cheese. Milk shakes. Beverages Coffee and tea, with or without caffeine. Carbonated beverages. Sodas. Energy drinks. Fruit juice made with acidic fruits (such as orange or grapefruit). Tomato juice. Alcoholic drinks. Fats and oils Butter. Margarine. Shortening. Ghee. Sweets and desserts Chocolate and cocoa. Donuts. Seasoning and other foods Pepper. Peppermint and spearmint. Any condiments, herbs, or seasonings that cause symptoms. For some people, this may include curry, hot sauce, or vinegar-based salad dressings. Summary  When you have gastroesophageal reflux disease (GERD), food and lifestyle choices are very important to help ease the discomfort of GERD.  Eat frequent, small meals instead of three large meals each day. Eat your meals slowly, in a relaxed setting. Avoid bending over or lying down until 2-3 hours after eating.  Limit high-fat   foods such as fatty meat or fried foods. This information is not intended to replace advice given to you by your health care provider. Make sure you discuss any questions you have with your health care provider. Document Released:  09/14/2005 Document Revised: 09/15/2016 Document Reviewed: 09/15/2016 Elsevier Interactive Patient Education  2019 [employer]  Thyroid Nodule  A thyroid nodule is an isolatedgrowth of thyroid cells that forms a lump in your thyroid gland. The thyroid gland is a butterfly-shaped gland. It is found in the lower front of your neck. This gland sends chemical messengers (hormones) through your blood to all parts of your body. These hormones are important in regulating your body temperature and helping your body to use energy. Thyroid nodules are common. Most are not cancerous (are benign). You may have one nodule or several nodules. Different types of thyroid nodules include:  Nodules that grow and fill with fluid (thyroid cysts).  Nodules that produce too much thyroid hormone (hot nodules or hyperthyroid).  Nodules that produce no thyroid hormone (cold nodules or hypothyroid).  Nodules that form from cancer cells (thyroid cancers). What are the causes? Usually, the cause of this condition is not known. What increases the risk? Factors that make this condition more likely to develop include:  Increasing age. Thyroid nodules become more common in people who are older than 54 years of age.  Gender. ? Benign thyroid nodules are more common in women. ? Cancerous (malignant) thyroid nodules are more common in men.  A family history that includes: ? Thyroid nodules. ? Pheochromocytoma. ? Thyroid carcinoma. ? Hyperparathyroidism.  Certain kinds of thyroid diseases, such as Hashimoto thyroiditis.  Lack of iodine.  A history of head and neck radiation, such as from X-rays. What are the signs or symptoms? It is common for this condition to cause no symptoms. If you have symptoms, they may include:  A lump in your lower neck.  Feeling a lump or tickle in your throat.  Pain in your neck, jaw, or ear.  Having trouble swallowing. Hot nodules may cause symptoms that include:  Weight  loss.  Warm, flushed skin.  Feeling hot.  Feeling nervous.  A racing heartbeat. Cold nodules may cause symptoms that include:  Weight gain.  Dry skin.  Brittle hair. This may also occur with hair loss.  Feeling cold.  Fatigue. Thyroid cancer nodules may cause symptoms that include:  Hard nodules that feel stuck to the thyroid gland.  Hoarseness.  Lumps in the glands near your thyroid (lymph nodes). How is this diagnosed? A thyroid nodule may be felt by your health care provider during a physical exam. This condition may also be diagnosed based on your symptoms. You may also have tests, including:  An ultrasound. This may be done to confirm the diagnosis.  A biopsy. This involves taking a sample from the nodule and looking at it under a microscope to see if the nodule is benign.  Blood tests to make sure that your thyroid is working properly.  Imaging tests such as MRI or CT scan may be done if: ? Your nodule is large. ? Your nodule is blocking your airway. ? Cancer is suspected. How is this treated? Treatment depends on the cause and size of your nodule or nodules. If the nodule is benign, treatment may not be necessary. Your health care provider may monitor the nodule to see if it goes away without treatment. If the nodule continues to grow, is cancerous, or does not go away:  It may need to be drained with a needle.  It may need to be removed with surgery. If you have surgery, part or all of your thyroid gland may need to be removed as well. Follow these instructions at home:  Pay attention to any changes in your nodule.  Take over-the-counter and prescription medicines only as told by your health care provider.  Keep all follow-up visits as told by your health care provider. This is important. Contact a health care provider if:  Your voice changes.  You have trouble swallowing.  You have pain in your neck, ear, or jaw that is getting worse.  Your  nodule gets bigger.  Your nodule starts to make it harder for you to breathe. Get help right away if:  You have a sudden fever.  You feel very weak.  Your muscles look like they are shrinking (muscle wasting).  You have mood swings.  You feel very restless.  You feel confused.  You are seeing or hearing things that other people do not see or hear (having hallucinations).  You feel suddenly nauseous or throw up.  You suddenly have diarrhea.  You have chest pain.  There is a loss of consciousness. This information is not intended to replace advice given to you by your health care provider. Make sure you discuss any questions you have with your health care provider. Document Released: 08/07/2004 Document Revised: 05/17/2016 Document Reviewed: 12/26/2014 Elsevier Interactive Patient Education  2019 [employer]    Vitamin D Deficiency Vitamin D deficiency is when your body does not have enough vitamin D. Vitamin D is important to your body for many reasons:  It helps the body to absorb two important minerals, called calcium and phosphorus.  It plays a role in bone health.  It may help to prevent some diseases, such as diabetes and multiple sclerosis.  It plays a role in muscle function, including heart function. You can get vitamin D by:  Eating foods that naturally contain vitamin D.  Eating or drinking milk or other dairy products that have vitamin D added to them.  Taking a vitamin D supplement or a multivitamin supplement that contains vitamin D.  Being in the sun. Your body naturally makes vitamin D when your skin is exposed to sunlight. Your body changes the sunlight into a form of the vitamin that the body can use. If vitamin D deficiency is severe, it can cause a condition in which your bones become soft. In adults, this condition is called osteomalacia. In children, this condition is called rickets. What are the causes? Vitamin D deficiency may be caused  by:  Not eating enough foods that contain vitamin D.  Not getting enough sun exposure.  Having certain digestive system diseases that make it difficult for your body to absorb vitamin D. These diseases include Crohn disease, chronic pancreatitis, and cystic fibrosis.  Having a surgery in which a part of the stomach or a part of the small intestine is removed.  Being obese.  Having chronic kidney disease or liver disease. What increases the risk? This condition is more likely to develop in:  Older people.  People who do not spend much time outdoors.  People who live in a long-term care facility.  People who have had broken bones.  People with weak or thin bones (osteoporosis).  People who have a disease or condition that changes how the body absorbs vitamin D.  People who have dark skin.  People who take certain medicines,  such as steroid medicines or certain seizure medicines.  People who are overweight or obese. What are the signs or symptoms? In mild cases of vitamin D deficiency, there may not be any symptoms. If the condition is severe, symptoms may include:  Bone pain.  Muscle pain.  Falling often.  Broken bones caused by a minor injury. How is this diagnosed? This condition is usually diagnosed with a blood test. How is this treated? Treatment for this condition may depend on what caused the condition. Treatment options include:  Taking vitamin D supplements.  Taking a calcium supplement. Your health care provider will suggest what dose is best for you. Follow these instructions at home:  Take medicines and supplements only as told by your health care provider.  Eat foods that contain vitamin D. Choices include: ? Fortified dairy products, cereals, or juices. Fortified means that vitamin D has been added to the food. Check the label on the package to be sure. ? Fatty fish, such as salmon or trout. ? Eggs. ? Oysters.  Do not use a tanning  bed.  Maintain a healthy weight. Lose weight, if needed.  Keep all follow-up visits as told by your health care provider. This is important. Contact a health care provider if:  Your symptoms do not go away.  You feel like throwing up (nausea) or you throw up (vomit).  You have fewer bowel movements than usual or it is difficult for you to have a bowel movement (constipation). This information is not intended to replace advice given to you by your health care provider. Make sure you discuss any questions you have with your health care provider. Document Released: 12/07/2011 Document Revised: 02/26/2016 Document Reviewed: 01/30/2015 Elsevier Interactive Patient Education  2019 [employer].

## 2018-11-30 ENCOUNTER — Encounter: Payer: Self-pay | Admitting: Internal Medicine

## 2018-11-30 DIAGNOSIS — E611 Iron deficiency: Secondary | ICD-10-CM | POA: Insufficient documentation

## 2018-11-30 DIAGNOSIS — E041 Nontoxic single thyroid nodule: Secondary | ICD-10-CM | POA: Insufficient documentation

## 2018-11-30 DIAGNOSIS — E785 Hyperlipidemia, unspecified: Secondary | ICD-10-CM | POA: Insufficient documentation

## 2018-11-30 NOTE — Progress Notes (Addendum)
Chief Complaint  Patient presents with  . Urinary Tract Infection   F/u  1. C/o increased freq of urination and pain on left side. She c/o urinary urgency/burning  2. Reviewed labs last visit  hypoK and HLD vit D def, iron def  3. Thyroid nodules on [place] rec f/u in 1 year thyroid labs abnormal will repeat them again today     Review of Systems  Constitutional: Negative for weight loss.  HENT: Negative for hearing loss.   Eyes: Negative for blurred vision.  Respiratory: Negative for shortness of breath.   Cardiovascular: Negative for chest pain.  Gastrointestinal: Negative for abdominal pain.  Genitourinary: Positive for urgency.  Musculoskeletal: Negative for falls.  Skin: Negative for rash.  Neurological: Negative for headaches.  Psychiatric/Behavioral: Negative for depression.   Past Medical History:  Diagnosis Date  . Allergy   . History of blood transfusion   . Migraines   . Seizure ([place])   . UTI (urinary tract infection)    Past Surgical History:  Procedure Laterality Date  . 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 CESAREAN SECTION     x 1  . COLONOSCOPY WITH PROPOFOL N/A 11/16/2018   Procedure: COLONOSCOPY WITH PROPOFOL;  Surgeon: Jonathon Bellows, MD;  Location: [facility] ENDOSCOPY;  Service: Gastroenterology;  Laterality: N/A;  . FOOT SURGERY    . KNEE SURGERY     Family History  Problem Relation Age of Onset  . Diabetes Father   . Cancer Father        sinus   . Hypertension Father   . Cancer Other   . Cancer Mother        pancreatic/whipple/chemo  . Hypertension Mother   . Cancer Maternal Grandmother        lung smoker  . Cancer Paternal Grandmother        elbow per pt    Social History   Socioeconomic History  . Marital status: Single    Spouse name: Not on file  . Number of children: Not on file  . Years of education: Not on file  . Highest education level: Not on file  Occupational History  . Not on file  Social Needs  .  Financial resource strain: Not on file  . Food insecurity:    Worry: Not on file    Inability: Not on file  . Transportation needs:    Medical: Not on file    Non-medical: Not on file  Tobacco Use  . Smoking status: Current Every Day Smoker    Packs/day: 0.50  . Smokeless tobacco: Never Used  Substance and Sexual Activity  . Alcohol use: Yes    Comment: occasional  . Drug use: No  . Sexual activity: Not Currently  Lifestyle  . Physical activity:    Days per week: Not on file    Minutes per session: Not on file  . Stress: Not on file  Relationships  . Social connections:    Talks on phone: Not on file    Gets together: Not on file    Attends religious service: Not on file    Active member of club or organization: Not on file    Attends meetings of clubs or organizations: Not on file    Relationship status: Not on file  . Intimate partner violence:    Fear of current or ex partner: Not on file    Emotionally abused: Not on file  Physically abused: Not on file    Forced sexual activity: Not on file  Other Topics Concern  . Not on file  Social History Narrative   4 kids    Single    Some college, truck driver    No guns    Wears seat belt    Safe in relationship   Current Meds  Medication Sig  . Cholecalciferol 1.25 MG (50000 UT) capsule Take 1 capsule (50,000 Units total) by mouth once a week.   Allergies  Allergen Reactions  . Compazine [Prochlorperazine] Shortness Of Breath  . Contrast Media [Iodinated Diagnostic Agents] Hives   Recent Results (from the past 2160 hour(s))  HIV antibody (with reflex)     Status: None   Collection Time: 11/03/18  2:22 PM  Result Value Ref Range   HIV 1&2 Ab, 4th Generation NON-REACTIVE NON-REACTI    Comment: HIV-1 antigen and HIV-1/HIV-2 antibodies were not detected. There is no laboratory evidence of HIV infection. Marland Kitchen PLEASE NOTE: This information has been disclosed to you from records whose confidentiality may  be protected by state law.  If your state requires such protection, then the state law prohibits you from making any further disclosure of the information without the specific written consent of the person to whom it pertains, or as otherwise permitted by law. A general authorization for the release of medical or other information is NOT sufficient for this purpose. . For additional information please refer to http://education.[facility].com/faq/FAQ106 (This link is being provided for informational/ educational purposes only.) . Marland Kitchen The performance of this assay has not been clinically validated in patients less than 90 years old. .   Hepatitis C antibody     Status: None   Collection Time: 11/03/18  2:22 PM  Result Value Ref Range   Hepatitis C Ab NON-REACTIVE NON-REACTI   SIGNAL TO CUT-OFF 0.02 <1.00    Comment: . HCV antibody was non-reactive. There is no laboratory  evidence of HCV infection. . In most cases, no further action is required. However, if recent HCV exposure is suspected, a test for HCV RNA (test code [id number]) is suggested. . For additional information please refer to http://education.[facility].com/faq/FAQ22v1 (This link is being provided for informational/ educational purposes only.) .   Hepatitis B surface antigen     Status: None   Collection Time: 11/03/18  2:22 PM  Result Value Ref Range   Hepatitis B Surface Ag NON-REACTIVE NON-REACTI  Hepatitis B surface antibody,quantitative     Status: Abnormal   Collection Time: 11/03/18  2:22 PM  Result Value Ref Range   Hepatitis B-Post <5 (L) > OR = 10 mIU/mL    Comment: . Patient does not have immunity to hepatitis B virus. . For additional information, please refer to http://education.[facility].com/faq/FAQ105 (This link is being provided for informational/ educational purposes only).   [place]     Status: None   Collection Time: 11/03/18  2:22 PM  Result Value Ref Range   FSH 94.0  mIU/ML    Comment: Female Reference Range:  1.4-18.1 mIU/mLFemale Reference Range:Follicular Phase          2.5-10.2 mIU/mLMidCycle Peak          3.4-33.4 mIU/mLLuteal Phase          1.5-9.1 mIU/mLPost Menopausal     23.0-116.3 mIU/mLPregnant          <0.3 mIU/mL  Vitamin D (25 hydroxy)     Status: Abnormal   Collection Time: 11/03/18  2:22 PM  Result Value  Ref Range   VITD <7.00 (L) 30.00 - 100.00 ng/mL  T4, free     Status: None   Collection Time: 11/03/18  2:22 PM  Result Value Ref Range   Free T4 0.75 0.60 - 1.60 ng/dL    Comment: Specimens from patients who are undergoing biotin therapy and /or ingesting biotin supplements may contain high levels of biotin.  The higher biotin concentration in these specimens interferes with this Free T4 assay.  Specimens that contain high levels  of biotin may cause false high results for this Free T4 assay.  Please interpret results in light of the total clinical presentation of the patient.    TSH     Status: Abnormal   Collection Time: 11/03/18  2:22 PM  Result Value Ref Range   TSH 0.33 (L) 0.35 - 4.50 uIU/mL  Lipid panel     Status: Abnormal   Collection Time: 11/03/18  2:22 PM  Result Value Ref Range   Cholesterol 136 0 - 200 mg/dL    Comment: ATP III Classification       Desirable:  < 200 mg/dL               Borderline High:  200 - 239 mg/dL          High:  > = 240 mg/dL   Triglycerides 171.0 (H) 0.0 - 149.0 mg/dL    Comment: Normal:  <150 mg/dLBorderline High:  150 - 199 mg/dL   HDL 33.90 (L) >39.00 mg/dL   VLDL 34.2 0.0 - 40.0 mg/dL   LDL Cholesterol 68 0 - 99 mg/dL   Total CHOL/HDL Ratio 4     Comment:                Men          Women1/2 Average Risk     3.4          3.3Average Risk          5.0          4.42X Average Risk          9.6          7.13X Average Risk          15.0          11.0                       NonHDL 101.94     Comment: NOTE:  Non-HDL goal should be 30 mg/dL higher than patient's LDL goal (i.e. LDL goal of < 70 mg/dL,  would have non-HDL goal of < 100 mg/dL)  Iron, TIBC and Ferritin Panel     Status: Abnormal   Collection Time: 11/03/18  2:22 PM  Result Value Ref Range   Iron 47 45 - 160 mcg/dL   TIBC 339 250 - 450 mcg/dL (calc)   %SAT 14 (L) 16 - 45 % (calc)   Ferritin 108 16 - 232 ng/mL  CBC with Differential/Platelet     Status: Abnormal   Collection Time: 11/03/18  2:22 PM  Result Value Ref Range   WBC 10.9 (H) 4.0 - 10.5 K/uL   RBC 4.66 3.87 - 5.11 Mil/uL   Hemoglobin 12.5 12.0 - 15.0 g/dL   HCT 37.9 36.0 - 46.0 %   MCV 81.4 78.0 - 100.0 fl   MCHC 33.0 30.0 - 36.0 g/dL   RDW 15.1 11.5 - 15.5 %   Platelets 243.0 150.0 - 400.0 K/uL  Neutrophils Relative % 52.1 43.0 - 77.0 %   Lymphocytes Relative 35.0 12.0 - 46.0 %   Monocytes Relative 7.4 3.0 - 12.0 %   Eosinophils Relative 4.9 0.0 - 5.0 %   Basophils Relative 0.6 0.0 - 3.0 %   Neutro Abs 5.7 1.4 - 7.7 K/uL   Lymphs Abs 3.8 0.7 - 4.0 K/uL   Monocytes Absolute 0.8 0.1 - 1.0 K/uL   Eosinophils Absolute 0.5 0.0 - 0.7 K/uL   Basophils Absolute 0.1 0.0 - 0.1 K/uL  Comprehensive metabolic panel     Status: Abnormal   Collection Time: 11/03/18  2:22 PM  Result Value Ref Range   Sodium 145 135 - 145 mEq/L   Potassium 3.0 (L) 3.5 - 5.1 mEq/L   Chloride 107 96 - 112 mEq/L   CO2 30 19 - 32 mEq/L   Glucose, Bld 99 70 - 99 mg/dL   BUN 8 6 - 23 mg/dL   Creatinine, Ser 0.91 0.40 - 1.20 mg/dL   Total Bilirubin 0.4 0.2 - 1.2 mg/dL   Alkaline Phosphatase 70 39 - 117 U/L   AST 12 0 - 37 U/L   ALT 14 0 - 35 U/L   Total Protein 7.4 6.0 - 8.3 g/dL   Albumin 4.2 3.5 - 5.2 g/dL   Calcium 9.0 8.4 - 10.5 mg/dL   GFR 78.19 >60.00 mL/min  Urinalysis, Routine w reflex microscopic     Status: None   Collection Time: 11/03/18  2:22 PM  Result Value Ref Range   Specific Gravity, UA 1.021 1.005 - 1.030   pH, UA 7.5 5.0 - 7.5   Color, UA Yellow Yellow   Appearance Ur Clear Clear   Leukocytes, UA Negative Negative   Protein, UA Negative Negative/Trace    Glucose, UA Negative Negative   Ketones, UA Negative Negative   RBC, UA Negative Negative   Bilirubin, UA Negative Negative   Urobilinogen, Ur 1.0 0.2 - 1.0 mg/dL   Nitrite, UA Negative Negative   Microscopic Examination Comment     Comment: Microscopic not indicated and not performed.  Surgical pathology     Status: None   Collection Time: 11/16/18  1:19 PM  Result Value Ref Range   SURGICAL PATHOLOGY      Surgical Pathology CASE: [id number] PATIENT: Kennedy Bucker Surgical Pathology Report     SPECIMEN SUBMITTED: A. Rectum polyp x2; cbx  CLINICAL HISTORY: None provided  PRE-OPERATIVE DIAGNOSIS: Screening colonoscopy  POST-OPERATIVE DIAGNOSIS: Rectal polyps     DIAGNOSIS: A.  RECTUM POLYP X2; COLD BIOPSY: - HYPERPLASTIC POLYP (2). - NEGATIVE FOR DYSPLASIA AND MALIGNANCY.  GROSS DESCRIPTION: A. Labeled: C BX polyp rectum X2 Received: Formalin Tissue fragment(s): Two Size: Each, 0.2 cm Description: Tan soft tissue fragments Entirely submitted in A1.   Final Diagnosis performed by Quay Burow, MD.   Electronically signed 11/17/2018 11:06:20AM The electronic signature indicates that the named Attending Pathologist has evaluated the specimen  Technical component performed at [facility], [address], [place], [place] [address] Lab: [phone] Dir: Rush Farmer, MD, MMM  Professional component performed at [facility], [facility], [place], [place], [place] [address] Lab: [phone] Dir: Dellia Nims. Rubinas, MD   Urinalysis, Routine w reflex microscopic     Status: Abnormal   Collection Time: 11/29/18  1:56 PM  Result Value Ref Range   Color, Urine YELLOW YELLOW   APPearance CLEAR CLEAR   Specific Gravity, Urine 1.007 1.001 - 1.03   pH 6.0 5.0 - 8.0  Glucose, UA NEGATIVE NEGATIVE   Bilirubin Urine NEGATIVE NEGATIVE   Ketones, ur NEGATIVE NEGATIVE   Hgb urine dipstick NEGATIVE NEGATIVE   Protein, ur NEGATIVE NEGATIVE   Nitrite  NEGATIVE NEGATIVE   Leukocytes,Ua 3+ (A) NEGATIVE   WBC, UA 40-60 (A) 0 - 5 /HPF   RBC / HPF NONE SEEN 0 - 2 /HPF   Squamous Epithelial / LPF NONE SEEN < OR = 5 /HPF   Bacteria, UA FEW (A) NONE SEEN /HPF   Hyaline Cast NONE SEEN NONE SEEN /LPF  TSH     Status: None   Collection Time: 11/29/18  1:56 PM  Result Value Ref Range   TSH 1.06 0.35 - 4.50 uIU/mL  T4, free     Status: None   Collection Time: 11/29/18  1:56 PM  Result Value Ref Range   Free T4 0.76 0.60 - 1.60 ng/dL    Comment: Specimens from patients who are undergoing biotin therapy and /or ingesting biotin supplements may contain high levels of biotin.  The higher biotin concentration in these specimens interferes with this Free T4 assay.  Specimens that contain high levels  of biotin may cause false high results for this Free T4 assay.  Please interpret results in light of the total clinical presentation of the patient.    T3, free     Status: None   Collection Time: 11/29/18  1:56 PM  Result Value Ref Range   T3, Free 3.8 2.3 - 4.2 pg/mL   Objective  Body mass index is 37.87 kg/m. Wt Readings from Last 3 Encounters:  11/29/18 234 lb 9.6 oz (106.4 kg)  11/16/18 231 lb (104.8 kg)  11/02/18 231 lb 1.9 oz (104.8 kg)   Temp Readings from Last 3 Encounters:  11/29/18 98.9 F (37.2 C) (Oral)  11/16/18 (!) 97 F (36.1 C) (Tympanic)  11/02/18 98.4 F (36.9 C) (Oral)   BP Readings from Last 3 Encounters:  11/29/18 100/66  11/16/18 130/89  11/02/18 128/70   Pulse Readings from Last 3 Encounters:  11/29/18 85  11/16/18 71  11/02/18 93    Physical Exam Vitals signs and nursing note reviewed.  Constitutional:      Appearance: Normal appearance. She is well-developed and well-groomed.  HENT:     Head: Normocephalic and atraumatic.     Nose: Nose normal.     Mouth/Throat:     Mouth: Mucous membranes are moist.     Pharynx: Oropharynx is clear.  Eyes:     Conjunctiva/sclera: Conjunctivae normal.     Pupils:  Pupils are equal, round, and reactive to light.  Cardiovascular:     Rate and Rhythm: Normal rate and regular rhythm.     Heart sounds: Normal heart sounds. No murmur.  Pulmonary:     Effort: Pulmonary effort is normal.     Breath sounds: Normal breath sounds.  Abdominal:     General: Abdomen is flat. Bowel sounds are normal. There is no distension.     Tenderness: There is no right CVA tenderness or left CVA tenderness.  Skin:    General: Skin is warm and dry.  Neurological:     General: No focal deficit present.     Mental Status: She is alert and oriented to person, place, and time. Mental status is at baseline.     Gait: Gait normal.  Psychiatric:        Attention and Perception: Attention and perception normal.        Mood and Affect:  Mood and affect normal.        Speech: Speech normal.        Behavior: Behavior normal. Behavior is cooperative.        Thought Content: Thought content normal.        Cognition and Memory: Cognition and memory normal.        Judgment: Judgment normal.     Assessment   1. C/o increased urinary freq/urge and left back pain ddx UTI/kidney stone  2. hypoK  3. HLD 4. Vitamin D  5. Thyroid nodules  6. HM Plan   1.  UA and culture  Trial of cipro  2.  Given high K list food list  3.  Given cholesterol  4.  50K D3 weekly  5. US thyroid in 1 year  Repeat thyroids labs  6.  Declines flu shot  Tdap per pt had in 2014  Consider pna 23, shingrix in future   Mammogram referred  Colonoscopy referred  Pap get record [facility] in [place] signed release today received no record  Smoker rec smoking cessation on and off since age 5 max 1 ppd now 1ppd FH lung cancer   Eye Dr. Sheran Lawless iron qd monitor for constipation  Given GERD list of foods    Provider: Dr. Olivia Mackie McLean-Scocuzza-Internal Medicine

## 2018-12-01 LAB — URINALYSIS, ROUTINE W REFLEX MICROSCOPIC
BILIRUBIN URINE: NEGATIVE
Glucose, UA: NEGATIVE
Hgb urine dipstick: NEGATIVE
Hyaline Cast: NONE SEEN /LPF
Ketones, ur: NEGATIVE
Nitrite: NEGATIVE
Protein, ur: NEGATIVE
RBC / HPF: NONE SEEN /HPF (ref 0–2)
Specific Gravity, Urine: 1.007 (ref 1.001–1.03)
Squamous Epithelial / HPF: NONE SEEN /HPF (ref <=5)
pH: 6 (ref 5.0–8.0)

## 2018-12-01 LAB — URINE CULTURE
MICRO NUMBER:: 269829
SPECIMEN QUALITY:: ADEQUATE

## 2018-12-06 NOTE — Progress Notes (Signed)
Patient not in [facility]. 

## 2018-12-12 ENCOUNTER — Encounter: Payer: Self-pay | Admitting: Internal Medicine

## 2019-03-03 ENCOUNTER — Ambulatory Visit (INDEPENDENT_AMBULATORY_CARE_PROVIDER_SITE_OTHER): Payer: BC Managed Care – PPO | Admitting: Internal Medicine

## 2019-03-03 ENCOUNTER — Other Ambulatory Visit: Payer: Self-pay

## 2019-03-03 DIAGNOSIS — E041 Nontoxic single thyroid nodule: Secondary | ICD-10-CM | POA: Diagnosis not present

## 2019-03-03 DIAGNOSIS — Z13220 Encounter for screening for lipoid disorders: Secondary | ICD-10-CM

## 2019-03-03 DIAGNOSIS — R05 Cough: Secondary | ICD-10-CM | POA: Diagnosis not present

## 2019-03-03 DIAGNOSIS — J040 Acute laryngitis: Secondary | ICD-10-CM

## 2019-03-03 DIAGNOSIS — E559 Vitamin D deficiency, unspecified: Secondary | ICD-10-CM

## 2019-03-03 DIAGNOSIS — D72829 Elevated white blood cell count, unspecified: Secondary | ICD-10-CM

## 2019-03-03 DIAGNOSIS — E876 Hypokalemia: Secondary | ICD-10-CM

## 2019-03-03 DIAGNOSIS — R059 Cough, unspecified: Secondary | ICD-10-CM

## 2019-03-03 DIAGNOSIS — R053 Chronic cough: Secondary | ICD-10-CM

## 2019-03-03 DIAGNOSIS — E611 Iron deficiency: Secondary | ICD-10-CM | POA: Diagnosis not present

## 2019-03-03 NOTE — Progress Notes (Signed)
Telephone Note  I connected with Carmen Cooley   on 03/03/19 at  9:40 AM EDT by telephone and verified that I am speaking with the correct person using two identifiers.  Location patient: home Location provider:work Persons participating in the virtual visit: patient, provider  I discussed the limitations of evaluation and management by telemedicine and the availability of in person appointments. The patient expressed understanding and agreed to proceed.   HPI: 1. COVID exposure coworker tested + 01/30/19 pt has been ok w/o sx's currently still working  2. C/o problems with voice box, chronic cough, thyroid nodule noted [place] 10/2018 at times feels like having trouble with words coming out of voicebox  3. Left shoulder pain resolved  4. UTI s'x resolved  5. Iron def anemia she is not doing otc iron Cooley to constipation but tries to eat food high in iron  6. Vit D def taking vitamin Dweekly D3    ROS: See pertinent positives and negatives per HPI.  Past Medical History:  Diagnosis Date  . Allergy   . History of blood transfusion   . Migraines   . Seizure ([place])   . UTI (urinary tract infection)     Past Surgical History:  Procedure Laterality Date  . 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 CESAREAN SECTION     x 1  . COLONOSCOPY WITH PROPOFOL N/A 11/16/2018   Procedure: COLONOSCOPY WITH PROPOFOL;  Surgeon: Jonathon Bellows, MD;  Location: [facility] ENDOSCOPY;  Service: Gastroenterology;  Laterality: N/A;  . FOOT SURGERY    . KNEE SURGERY      Family History  Problem Relation Age of Onset  . Diabetes Father   . Cancer Father        sinus   . Hypertension Father   . Cancer Other   . Cancer Mother        pancreatic/whipple/chemo  . Hypertension Mother   . Cancer Maternal Grandmother        lung smoker  . 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  Cholecalciferol 1.25 MG (50000 UT) capsule, Take 1 capsule (50,000 Units total) by mouth once a week., Disp: 13 capsule, Rfl: 3  EXAM:  VITALS per patient if applicable:  GENERAL: alert, oriented, appears well and in no acute distress  PSYCH/NEURO: pleasant and cooperative, no obvious depression or anxiety, speech and thought processing grossly intact  ASSESSMENT AND PLAN:  Discussed the following assessment and plan:  Chronic cough - Plan: Ambulatory referral to ENT Thyroid nodule - Plan: Ambulatory referral to ENT Laryngitis - Plan: Ambulatory referral to ENT -refer to ENT for laryngoscopy to w/u for LPR, GERD cause of sxs vs thyroid nodule max size 1.9 cm   Iron deficiency-pt unable to tolerate iron eating foods high in iron   Vitamin D deficiency-weekly D3   HM Declines flu shot  Tdap per pt had in 2014  Consider pna 23, shingrix in future   Mammogram referred pt to call and schedule  Colonoscopy 11-16-18 hyperplastic polyps f/u 10 years  Pap get record [facility] in [place] signed release today received no record  -pt will call to try to get record of pap  Smoker rec smoking cessation on and  off since age 60 max 1 ppd now 1ppd FH lung cancer  US thyroid 11/23/18 left lower nodules f/u 1 year   Eye Dr. Gloriann Loan Given GERD list of foods previously    I discussed the assessment and treatment plan with the patient. The patient was provided an opportunity to ask questions and all were answered. The patient agreed with the plan and demonstrated an understanding of the instructions.   The patient was advised to call back or seek an in-person evaluation if the symptoms worsen or if the condition fails to improve as anticipated.  Time spent 15 minutes  Delorise Jackson, MD

## 2019-03-30 ENCOUNTER — Ambulatory Visit: Payer: Self-pay

## 2019-03-30 ENCOUNTER — Other Ambulatory Visit: Payer: Self-pay

## 2019-03-30 ENCOUNTER — Encounter: Payer: Self-pay | Admitting: Internal Medicine

## 2019-03-30 ENCOUNTER — Ambulatory Visit (INDEPENDENT_AMBULATORY_CARE_PROVIDER_SITE_OTHER): Payer: BC Managed Care – PPO | Admitting: Internal Medicine

## 2019-03-30 DIAGNOSIS — Z20822 Contact with and (suspected) exposure to covid-19: Secondary | ICD-10-CM

## 2019-03-30 DIAGNOSIS — K921 Melena: Secondary | ICD-10-CM

## 2019-03-30 DIAGNOSIS — Z20828 Contact with and (suspected) exposure to other viral communicable diseases: Secondary | ICD-10-CM

## 2019-03-30 DIAGNOSIS — K2970 Gastritis, unspecified, without bleeding: Secondary | ICD-10-CM

## 2019-03-30 MED ORDER — PANTOPRAZOLE SODIUM 40 MG PO TBEC: 40.0000 mg | DELAYED_RELEASE_TABLET | Freq: Every day | ORAL | 1 refills | Status: DC

## 2019-03-30 NOTE — Telephone Encounter (Signed)
She has blood in stool needs to go to ED   [place]

## 2019-03-30 NOTE — Telephone Encounter (Signed)
The front office scheduled this patient a virtual visit with Dr. Aundra Dubin today at 2:30 pm.

## 2019-03-30 NOTE — Telephone Encounter (Signed)
Patient called and says she's been having blood in her stools for the past week. She says the stool is formed and soft with red blood in it and it turns the water red, then she sees blood on the tissue when she wipes. She says she doesn't see blood clots, that she can tell. She says last week she was having a BM twice a day, she didn't have a BM yesterday, then today she had a BM and saw the blood, so she called the office. She denies any other symptoms, no abdominal, no dizziness. I called the office and spoke to [place], [facility] who asked to speak to the patient, the call was connected successfully.   Answer Assessment - Initial Assessment Questions 1. APPEARANCE of BLOOD: "What color is it?" "Is it passed separately, on the surface of the stool, or mixed in with the stool?"      Red water, red mixed in the stool 2. AMOUNT: "How much blood was passed?"      Hard to say, but it's noticeable 3. FREQUENCY: "How many times has blood been passed with the stools?"      With every stool, last week twice/day 4. ONSET: "When was the blood first seen in the stools?" (Days or weeks)      1 week ago 5. DIARRHEA: "Is there also some diarrhea?" If so, ask: "How many diarrhea stools were passed in past 24 hours?"      No 6. CONSTIPATION: "Do you have constipation?" If so, "How bad is it?"     No 7. RECURRENT SYMPTOMS: "Have you had blood in your stools before?" If so, ask: "When was the last time?" and "What happened that time?"      Only when had to strain, but no straining now 8. BLOOD THINNERS: "Do you take any blood thinners?" (e.g., Coumadin/warfarin, Pradaxa/dabigatran, aspirin)     No 9. OTHER SYMPTOMS: "Do you have any other symptoms?"  (e.g., abdominal pain, vomiting, dizziness, fever)     No 10. PREGNANCY: "Is there any chance you are pregnant?" "When was your last menstrual period?"      No  Protocols used: RECTAL BLEEDING-A-AH

## 2019-03-30 NOTE — Telephone Encounter (Signed)
She is your 1430. She has already checked in.

## 2019-03-30 NOTE — Progress Notes (Signed)
Virtual Visit via Video Note  I connected with Carmen Cooley   on 03/30/19 at  2:30 PM EDT by a video enabled telemedicine application and verified that I am speaking with the correct person using two identifiers.  Location patient: home Location provider:work  Persons participating in the virtual visit: patient, provider  I discussed the limitations of evaluation and management by telemedicine and the availability of in person appointments. The patient expressed understanding and agreed to proceed.   HPI: Blood in stool x 1 week.She is having 2 stools per day without constipation/diarrhea but has increased fiber and noted increased blood in stool red blood and in the toilet   H/o COVID exposure at work w/o sx's and mom immunocompromised awnts COVID 19 antibody testing   ROS: See pertinent positives and negatives per HPI.  Past Medical History:  Diagnosis Date  . Allergy   . History of blood transfusion   . Migraines   . Seizure ([place])   . UTI (urinary tract infection)     Past Surgical History:  Procedure Laterality Date  . 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 CESAREAN SECTION     x 1  . COLONOSCOPY WITH PROPOFOL N/A 11/16/2018   Procedure: COLONOSCOPY WITH PROPOFOL;  Surgeon: Jonathon Bellows, MD;  Location: [facility] ENDOSCOPY;  Service: Gastroenterology;  Laterality: N/A;  . FOOT SURGERY    . KNEE SURGERY      Family History  Problem Relation Age of Onset  . Diabetes Father   . Cancer Father        sinus   . Hypertension Father   . Cancer Other   . Cancer Mother        pancreatic/whipple/chemo  . Hypertension Mother   . Cancer Maternal Grandmother        lung smoker  . 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  Cholecalciferol 1.25 MG (50000 UT) capsule, Take 1 capsule (50,000 Units  total) by mouth once a week., Disp: 13 capsule, Rfl: 3  EXAM:  VITALS per patient if applicable:  GENERAL: alert, oriented, appears well and in no acute distress  HEENT: atraumatic, conjunttiva clear, no obvious abnormalities on inspection of external nose and ears  NECK: normal movements of the head and neck  LUNGS: on inspection no signs of respiratory distress, breathing rate appears normal, no obvious gross SOB, gasping or wheezing  CV: no obvious cyanosis  MS: moves all visible extremities without noticeable abnormality  PSYCH/NEURO: pleasant and cooperative, no obvious depression or anxiety, speech and thought processing grossly intact  ASSESSMENT AND PLAN:  Discussed the following assessment and plan:  Blood in stool - Plan: Ambulatory referral to Gastroenterology, Clostridium Difficile by PCR([facility]), GI pathogen panel by PCR, stool, CANCELED: Clostridium Difficile by PCR([facility]),  -referred to Dr. Jonathon Bellows she may need EGD  -does not want to go to ED though I rec  -Will start protonix 40 mg qd   Exposure to Covid-19 Virus - Plan: SAR CoV2 Serology (COVID 19)AB(IGG)IA, SARS-CoV-2 Antibody, IgM, [facility]     I discussed the assessment and treatment plan with the patient. The patient was provided an opportunity to ask questions and all were answered. The patient agreed with the  plan and demonstrated an understanding of the instructions.   The patient was advised to call back or seek an in-person evaluation if the symptoms worsen or if the condition fails to improve as anticipated.  Time spent 15 minutes  Delorise Jackson, MD

## 2019-03-30 NOTE — Telephone Encounter (Signed)
Attempted to return call to patient. Message states person not receiving call at this time and call back later.

## 2019-03-30 NOTE — Telephone Encounter (Signed)
We can also try to set her up with Dr. Bailey Mech Does she want [place] to make her an appt

## 2019-03-31 LAB — SAR COV2 SEROLOGY (COVID19)AB(IGG),IA: SARS-CoV-2 Ab, IgG: NEGATIVE

## 2019-03-31 LAB — SARS-COV-2 ANTIBODY, IGM: SARS-CoV-2 Antibody, IgM: NEGATIVE

## 2019-04-03 ENCOUNTER — Other Ambulatory Visit
Admission: RE | Admit: 2019-04-03 | Discharge: 2019-04-03 | Disposition: A | Discharge status: Home / Self Care | Payer: BC Managed Care – PPO | Source: Ambulatory Visit | Attending: Internal Medicine | Admitting: Internal Medicine

## 2019-04-03 DIAGNOSIS — K921 Melena: Secondary | ICD-10-CM | POA: Insufficient documentation

## 2019-04-03 LAB — GASTROINTESTINAL PANEL BY PCR, STOOL (REPLACES STOOL CULTURE)

## 2019-04-03 LAB — C DIFFICILE QUICK SCREEN W PCR REFLEX
C Diff antigen: NEGATIVE
C Diff interpretation: NOT DETECTED
C Diff toxin: NEGATIVE

## 2019-05-04 ENCOUNTER — Ambulatory Visit: Payer: BC Managed Care – PPO | Admitting: Gastroenterology

## 2019-05-11 ENCOUNTER — Ambulatory Visit (INDEPENDENT_AMBULATORY_CARE_PROVIDER_SITE_OTHER): Payer: BC Managed Care – PPO | Admitting: Gastroenterology

## 2019-05-11 ENCOUNTER — Other Ambulatory Visit: Payer: Self-pay

## 2019-05-11 VITALS — Ht 66.0 in | Wt 237.6 lb

## 2019-05-11 DIAGNOSIS — K921 Melena: Secondary | ICD-10-CM | POA: Diagnosis not present

## 2019-05-11 NOTE — Progress Notes (Signed)
Carmen Bellows Carmen Cooley, MRCP(U.K) [address]  [place]  [place], [place] [address]  Main: [phone]  Fax: [phone]   Gastroenterology Consultation  Referring Provider:     McLean-Scocuzza, Carmen Cooley * Primary Care Physician:  Carmen Cooley, Carmen Glow, Carmen Cooley Primary Gastroenterologist:  Dr. Jonathon Cooley  Reason for Consultation:     Blood in the stool        HPI:   Carmen Cooley is a 53 y.o. y/o female referred for consultation & management  by Dr. Terese Door, Carmen Glow, Carmen Cooley.     She was seen recently by Dr.McLean-Scocuzza, Carmen Glow, Carmen Cooley on 03/30/2019 and complained of blood in stool for 1 week.  Stool was tested for C. difficile and was negative, GI PCR was also negative.  No recent CBC.  Recent colonoscopy on 11/16/2018 performed by myself showed 2 diminutive polyps.  They were hyperplastic.  She says that she is been having a lot of perianal itching, bright red blood on the tissue paper in the toilet bowl and on the stool.  Denies any constipation.  She states that she feels like something is crawling in her her perianal area. Past Medical History:  Diagnosis Date  . Allergy   . History of blood transfusion   . Migraines   . Seizure ([place])   . UTI (urinary tract infection)     Past Surgical History:  Procedure Laterality Date  . 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 CESAREAN SECTION     x 1  . COLONOSCOPY WITH PROPOFOL N/A 11/16/2018   Procedure: COLONOSCOPY WITH PROPOFOL;  Surgeon: Carmen Bellows, Carmen Cooley;  Location: [facility] ENDOSCOPY;  Service: Gastroenterology;  Laterality: N/A;  . FOOT SURGERY    . KNEE SURGERY      Prior to Admission medications   Medication Sig Start Date End Date Taking? Authorizing Provider  Cholecalciferol 1.25 MG (50000 UT) capsule Take 1 capsule (50,000 Units total) by mouth once a week. 11/03/18   Carmen Cooley, Carmen Glow, Carmen Cooley  pantoprazole (PROTONIX) 40 MG tablet Take 1 tablet (40 mg total) by mouth daily. 30 minutes before food 03/30/19    Carmen Cooley, Carmen Glow, Carmen Cooley    Family History  Problem Relation Age of Onset  . Diabetes Father   . Cancer Father        sinus   . Hypertension Father   . Cancer Other   . Cancer Mother        pancreatic/whipple/chemo  . Hypertension Mother   . Cancer Maternal Grandmother        lung smoker  . Cancer Paternal Grandmother        elbow per pt      Social History   Tobacco Use  . Smoking status: Current Every Day Smoker    Packs/day: 0.50  . Smokeless tobacco: Never Used  Substance Use Topics  . Alcohol use: Yes    Comment: occasional  . Drug use: No    Allergies as of 05/11/2019 - Review Complete 03/03/2019  Allergen Reaction Noted  . Compazine [prochlorperazine] Shortness Of Breath 06/27/2013  . Contrast media [iodinated diagnostic agents] Hives 06/27/2013    Review of Systems:    All systems reviewed and negative except where noted in HPI.   Physical Exam:  There were no vitals taken for this visit. No LMP recorded. Patient is postmenopausal. Psych:  Alert and cooperative. Normal mood and affect. General:   Alert,  Well-developed, well-nourished, pleasant and cooperative  in NAD Head:  Normocephalic and atraumatic. Eyes:  Sclera clear, no icterus.   Conjunctiva pink. Ears:  Normal auditory acuity. Nose:  No deformity, discharge, or lesions. Mouth:  No deformity or lesions,oropharynx pink & moist. Neck:  Supple; no masses or thyromegaly. Lungs:  Respirations even and unlabored.  Clear throughout to auscultation.   No wheezes, crackles, or rhonchi. No acute distress. Heart:  Regular rate and rhythm; no murmurs, clicks, rubs, or gallops. Abdomen:  Normal bowel sounds.  No bruits.  Soft, non-tender and non-distended without masses, hepatosplenomegaly or hernias noted.  No guarding or rebound tenderness.    Neurologic:  Alert and oriented x3;  grossly normal neurologically. Skin:  Intact without significant lesions or rashes. No jaundice. Lymph Nodes:  No  significant cervical adenopathy. Psych:  Alert and cooperative. Normal mood and affect.  Imaging Studies: No results found.  Assessment and Plan:   Carmen Cooley is a 53 y.o. y/o female has been referred for blood in the stool.  I had performed a colonoscopy 6 months back and had noted no abnormalities in the rectum or the entire colon except a few diminutive polyps.  Her clinical history is very suggestive of internal hemorrhoids.  Looking back at my last colonoscopy report I could not see any large hemorrhoids.  Hence I would like to confirm that she does have hemorrhoids and I have counseled her extensively about conservative management of hemorrhoids including the use of stool softeners, good-quality tissue paper, sitz bath and potentially Preparation H.  I have also strongly suggested her to her not to rub or scratch her perianal area as that will further inflamed the hemorrhoids and lead to further itching.  This can contribute to the bleeding.  Plan 1.  Flexible sigmoidoscopy, check CBC  I have discussed alternative options, risks & benefits,  which include, but are not limited to, bleeding, infection, perforation,respiratory complication & drug reaction.  The patient agrees with this plan & written consent will be obtained.     Follow up in 4 to 5 weeks  Dr Carmen Bellows Carmen Cooley,MRCP(U.K)

## 2019-05-12 ENCOUNTER — Encounter: Payer: Self-pay | Admitting: Gastroenterology

## 2019-05-12 LAB — CBC
Hematocrit: 39.4 % (ref 34.0–46.6)
Hemoglobin: 13 g/dL (ref 11.1–15.9)
MCH: 26.6 pg (ref 26.6–33.0)
MCHC: 33 g/dL (ref 31.5–35.7)
MCV: 81 fL (ref 79–97)
Platelets: 295 10*3/uL (ref 150–450)
RBC: 4.88 x10E6/uL (ref 3.77–5.28)
RDW: 14.7 % (ref 11.7–15.4)
WBC: 15.3 10*3/uL — ABNORMAL HIGH (ref 3.4–10.8)

## 2019-05-22 ENCOUNTER — Other Ambulatory Visit
Admission: RE | Admit: 2019-05-22 | Discharge: 2019-05-22 | Disposition: A | Discharge status: Home / Self Care | Payer: BC Managed Care – PPO | Source: Ambulatory Visit | Attending: Gastroenterology | Admitting: Gastroenterology

## 2019-05-22 ENCOUNTER — Other Ambulatory Visit: Payer: Self-pay

## 2019-05-22 DIAGNOSIS — Z01812 Encounter for preprocedural laboratory examination: Secondary | ICD-10-CM | POA: Diagnosis not present

## 2019-05-22 DIAGNOSIS — Z20828 Contact with and (suspected) exposure to other viral communicable diseases: Secondary | ICD-10-CM | POA: Insufficient documentation

## 2019-05-22 LAB — SARS CORONAVIRUS 2 (TAT 6-24 HRS): SARS Coronavirus 2: NEGATIVE

## 2019-05-25 ENCOUNTER — Ambulatory Visit: Payer: BC Managed Care – PPO | Admitting: Anesthesiology

## 2019-05-25 ENCOUNTER — Ambulatory Visit
Admission: RE | Admit: 2019-05-25 | Discharge: 2019-05-25 | Disposition: A | Discharge status: Home / Self Care | Payer: BC Managed Care – PPO | Attending: Gastroenterology | Admitting: Gastroenterology

## 2019-05-25 ENCOUNTER — Other Ambulatory Visit: Payer: Self-pay

## 2019-05-25 ENCOUNTER — Encounter
Admission: RE | Disposition: A | Discharge status: Home / Self Care | Payer: Self-pay | Source: Home / Self Care | Attending: Gastroenterology

## 2019-05-25 DIAGNOSIS — F17210 Nicotine dependence, cigarettes, uncomplicated: Secondary | ICD-10-CM | POA: Diagnosis not present

## 2019-05-25 DIAGNOSIS — Z79899 Other long term (current) drug therapy: Secondary | ICD-10-CM | POA: Diagnosis not present

## 2019-05-25 DIAGNOSIS — K625 Hemorrhage of anus and rectum: Secondary | ICD-10-CM | POA: Diagnosis not present

## 2019-05-25 DIAGNOSIS — K640 First degree hemorrhoids: Secondary | ICD-10-CM | POA: Insufficient documentation

## 2019-05-25 DIAGNOSIS — Z888 Allergy status to other drugs, medicaments and biological substances status: Secondary | ICD-10-CM | POA: Insufficient documentation

## 2019-05-25 DIAGNOSIS — K921 Melena: Secondary | ICD-10-CM | POA: Diagnosis not present

## 2019-05-25 DIAGNOSIS — Z91041 Radiographic dye allergy status: Secondary | ICD-10-CM | POA: Insufficient documentation

## 2019-05-25 HISTORY — PX: FLEXIBLE SIGMOIDOSCOPY: SHX5431

## 2019-05-25 SURGERY — SIGMOIDOSCOPY, FLEXIBLE
Anesthesia: General

## 2019-05-25 MED ORDER — PROPOFOL 500 MG/50ML IV EMUL
INTRAVENOUS | Status: DC | PRN
  Administered 2019-05-25: 140 ug/kg/min via INTRAVENOUS

## 2019-05-25 MED ORDER — SODIUM CHLORIDE 0.9 % IV SOLN
INTRAVENOUS | Status: DC
  Administered 2019-05-25: 10:00:00 via INTRAVENOUS

## 2019-05-25 MED ORDER — PROPOFOL 10 MG/ML IV BOLUS
INTRAVENOUS | Status: DC | PRN
  Administered 2019-05-25: 100 mg via INTRAVENOUS

## 2019-05-25 NOTE — Op Note (Signed)
[facility] Gastroenterology Patient Name: Carmen Cooley Procedure Date: 05/25/2019 10:18 AM MRN: [id number] Account #: [id number] Date of Birth: June 20, 1966 Admit Type: Outpatient Age: 53 Room: [facility] ENDO ROOM 4 Gender: Female Note Status: Finalized Procedure:            Flexible Sigmoidoscopy Indications:          Rectal hemorrhage Providers:            Jonathon Bellows MD, MD Referring MD:         Nino Glow Mclean-Scocuzza MD, MD (Referring MD) Medicines:            Monitored Anesthesia Care Complications:        No immediate complications. Procedure:            Pre-Anesthesia Assessment:                       - Prior to the procedure, a History and Physical was                        performed, and patient medications, allergies and                        sensitivities were reviewed. The patient's tolerance of                        previous anesthesia was reviewed.                       - The risks and benefits of the procedure and the                        sedation options and risks were discussed with the                        patient. All questions were answered and informed                        consent was obtained.                       - ASA Grade Assessment: II - A patient with mild                        systemic disease.                       After obtaining informed consent, the scope was passed                        under direct vision. The Colonoscope was introduced                        through the anus and advanced to the the left                        transverse colon. The flexible sigmoidoscopy was                        accomplished with ease. The patient tolerated the  procedure well. The quality of the bowel preparation                        was fair. Findings:      The perianal and digital rectal examinations were normal.      The perianal and digital rectal examinations were normal.      Non-bleeding internal  hemorrhoids were found during retroflexion. The       hemorrhoids were small and Grade I (internal hemorrhoids that do not       prolapse).      The exam was otherwise without abnormality. Impression:           - Preparation of the colon was fair.                       - Non-bleeding internal hemorrhoids.                       - The examination was otherwise normal.                       - No specimens collected. Recommendation:       - Use Citrucel one tablespoon PO TID.                       - Discharge patient to home (with escort).                       - Resume previous diet.                       - Return to my office in 6 weeks. Procedure Code(s):    --- Professional ---                       [phone], Sigmoidoscopy, flexible; diagnostic, including                        collection of specimen(s) by brushing or washing, when                        performed (separate procedure) Diagnosis Code(s):    --- Professional ---                       K64.0, First degree hemorrhoids                       K62.5, Hemorrhage of anus and rectum CPT copyright 2019 American Medical Association. All rights reserved. The codes documented in this report are preliminary and upon coder review may  be revised to meet current compliance requirements. Jonathon Bellows, MD Jonathon Bellows MD, MD 05/25/2019 10:32:28 AM This report has been signed electronically. Number of Addenda: 0 Note Initiated On: 05/25/2019 10:18 AM Total Procedure Duration: 0 hours 5 minutes 6 seconds  Estimated Blood Loss: Estimated blood loss: none.      [facility]

## 2019-05-25 NOTE — Anesthesia Post-op Follow-up Note (Signed)
Anesthesia QCDR form completed.        

## 2019-05-25 NOTE — Anesthesia Preprocedure Evaluation (Signed)
Anesthesia Evaluation  Patient identified by MRN, date of birth, ID band Patient awake    Reviewed: Allergy & Precautions, H&P , NPO status , reviewed documented beta blocker date and time   Airway Mallampati: II  TM Distance: >3 FB Neck ROM: full    Dental  (+) Upper Dentures, Lower Dentures   Pulmonary Current Smoker and Patient abstained from smoking.,    Pulmonary exam normal        Cardiovascular negative cardio ROS Normal cardiovascular exam     Neuro/Psych  Headaches, Seizures -,  No seizures since 1981, no meds negative psych ROS   GI/Hepatic Neg liver ROS,   Endo/Other  negative endocrine ROSGoiter   Renal/GU negative Renal ROS     Musculoskeletal negative musculoskeletal ROS (+)   Abdominal   Peds  Hematology negative hematology ROS (+)   Anesthesia Other Findings Past Medical History: No date: Allergy No date: History of blood transfusion No date: Migraines No date: Seizure ([place]) No date: UTI (urinary tract infection)  Past Surgical History: No date: BRAIN TUMOR EXCISION     Comment:  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  No date: CESAREAN SECTION     Comment:  x 1 No date: FOOT SURGERY No date: KNEE SURGERY     Reproductive/Obstetrics                             Anesthesia Physical  Anesthesia Plan  ASA: II  Anesthesia Plan: General   Post-op Pain Management:    Induction: Intravenous  PONV Risk Score and Plan: 2 and Treatment may vary due to age or medical condition and TIVA  Airway Management Planned: Nasal Cannula and Natural Airway  Additional Equipment:   Intra-op Plan:   Post-operative Plan:   Informed Consent: I have reviewed the patients History and Physical, chart, labs and discussed the procedure including the risks, benefits and alternatives for the proposed anesthesia with the patient or authorized representative who has indicated his/her  understanding and acceptance.     Dental Advisory Given  Plan Discussed with: CRNA  Anesthesia Plan Comments:         Anesthesia Quick Evaluation

## 2019-05-25 NOTE — Transfer of Care (Signed)
Immediate Anesthesia Transfer of Care Note  Patient: Carmen Cooley  Procedure(s) Performed: FLEXIBLE SIGMOIDOSCOPY (N/A )  Patient Location: PACU  Anesthesia Type:General  Level of Consciousness: sedated  Airway & Oxygen Therapy: Patient Spontanous Breathing and Patient connected to nasal cannula oxygen  Post-op Assessment: Report given to RN and Post -op Vital signs reviewed and stable  Post vital signs: Reviewed and stable  Last Vitals:  Vitals Value Taken Time  BP 89/58 05/25/19 1036  Temp 36.3 C 05/25/19 1036  Pulse 77 05/25/19 1036  Resp 21 05/25/19 1036  SpO2 94 % 05/25/19 1036    Last Pain:  Vitals:   05/25/19 1036  TempSrc:   PainSc: 0-No pain         Complications: No apparent anesthesia complications

## 2019-05-25 NOTE — H&P (Signed)
Jonathon Bellows, MD [address] [place], [place], [place], [address] [address], [place], [place], [place], [address] Phone: [phone]  Fax: [phone]  Primary Care Physician:  McLean-Scocuzza, Nino Glow, MD   Pre-Procedure History & Physical: HPI:  Carmen Cooley is a 53 y.o. female is here for a sigmoidoscopy    Past Medical History:  Diagnosis Date  . Allergy   . History of blood transfusion   . Migraines   . Seizure ([place])   . UTI (urinary tract infection)     Past Surgical History:  Procedure Laterality Date  . 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 CESAREAN SECTION     x 1  . COLONOSCOPY WITH PROPOFOL N/A 11/16/2018   Procedure: COLONOSCOPY WITH PROPOFOL;  Surgeon: Jonathon Bellows, MD;  Location: [facility] ENDOSCOPY;  Service: Gastroenterology;  Laterality: N/A;  . FOOT SURGERY    . KNEE SURGERY      Prior to Admission medications   Medication Sig Start Date End Date Taking? Authorizing Provider  Cholecalciferol 1.25 MG (50000 UT) capsule Take 1 capsule (50,000 Units total) by mouth once a week. Patient not taking: Reported on 05/25/2019 11/03/18   McLean-Scocuzza, Nino Glow, MD  pantoprazole (PROTONIX) 40 MG tablet Take 1 tablet (40 mg total) by mouth daily. 30 minutes before food 03/30/19   McLean-Scocuzza, Nino Glow, MD    Allergies as of 05/16/2019 - Review Complete 05/11/2019  Allergen Reaction Noted  . Compazine [prochlorperazine] Shortness Of Breath 06/27/2013  . Contrast media [iodinated diagnostic agents] Hives 06/27/2013    Family History  Problem Relation Age of Onset  . Diabetes Father   . Cancer Father        sinus   . Hypertension Father   . Cancer Other   . Cancer Mother        pancreatic/whipple/chemo  . Hypertension Mother   . Cancer Maternal Grandmother        lung smoker  . Cancer Paternal Grandmother        elbow per pt     Social History   Socioeconomic History  . Marital status: Single    Spouse name: Not on  file  . Number of children: Not on file  . Years of education: Not on file  . Highest education level: Not on file  Occupational History  . Not on file  Social Needs  . Financial resource strain: Not on file  . Food insecurity    Worry: Not on file    Inability: Not on file  . Transportation needs    Medical: Not on file    Non-medical: Not on file  Tobacco Use  . Smoking status: Current Every Day Smoker    Packs/day: 0.50  . Smokeless tobacco: Never Used  Substance and Sexual Activity  . Alcohol use: Yes    Comment: occasional  . Drug use: No  . Sexual activity: Not Currently  Lifestyle  . Physical activity    Days per week: Not on file    Minutes per session: Not on file  . Stress: Not on file  Relationships  . Social Herbalist on phone: Not on file    Gets together: Not on file    Attends religious service: Not on file    Active member of club or organization: Not on file    Attends meetings of clubs or organizations: Not on file  Relationship status: Not on file  . Intimate partner violence    Fear of current or ex partner: Not on file    Emotionally abused: Not on file    Physically abused: Not on file    Forced sexual activity: Not on file  Other Topics Concern  . Not on file  Social History Narrative   4 kids    Single    Some college, truck driver    No guns    Wears seat belt    Safe in relationship    Review of Systems: See HPI, otherwise negative ROS  Physical Exam: BP 128/71   Pulse 82   Temp 98.3 F (36.8 C) (Oral)   Resp 18   Ht 5\' 6"  (1.676 m)   Wt 108.4 kg   SpO2 97%   BMI 38.58 kg/m  General:   Alert,  pleasant and cooperative in NAD Head:  Normocephalic and atraumatic. Neck:  Supple; no masses or thyromegaly. Lungs:  Clear throughout to auscultation, normal respiratory effort.    Heart:  +S1, +S2, Regular rate and rhythm, No edema. Abdomen:  Soft, nontender and nondistended. Normal bowel sounds, without guarding,  and without rebound.   Neurologic:  Alert and  oriented x4;  grossly normal neurologically.  Impression/Plan: Laqueta Due is here for an colonoscopy to be performed for  A sigmoidoscopy to evaluiate for rectal bleeding Risks, benefits, limitations, and alternatives regarding  colonoscopy have been reviewed with the patient.  Questions have been answered.  All parties agreeable.   Jonathon Bellows, MD  05/25/2019, 9:59 AM

## 2019-05-26 ENCOUNTER — Encounter: Payer: Self-pay | Admitting: Gastroenterology

## 2019-05-26 NOTE — Anesthesia Postprocedure Evaluation (Signed)
Anesthesia Post Note  Patient: Carmen Cooley  Procedure(s) Performed: FLEXIBLE SIGMOIDOSCOPY (N/A )  Anesthesia Type: General Level of consciousness: awake and alert and oriented Pain management: pain level controlled Vital Signs Assessment: post-procedure vital signs reviewed and stable Respiratory status: spontaneous breathing Cardiovascular status: blood pressure returned to baseline Anesthetic complications: no     Last Vitals:  Vitals:   05/25/19 1050 05/25/19 1100  BP: 112/81 113/75  Pulse: 83 73  Resp: 17 (!) 23  Temp:    SpO2: 96% 97%    Last Pain:  Vitals:   05/26/19 0737  TempSrc:   PainSc: 0-No pain                 Marylin Lathon

## 2019-06-23 IMAGING — US US THYROID
1 series · 13 of 25 positions shown · non-contrast
Comparison: None.

CLINICAL DATA: Goiter.

EXAM:
THYROID ULTRASOUND
TECHNIQUE: Ultrasound examination of the thyroid gland and adjacent soft
tissues was performed.

[Series 1: us thyroid · 0.06mm/px · 13 of 82 slices shown]
[im 1/82]
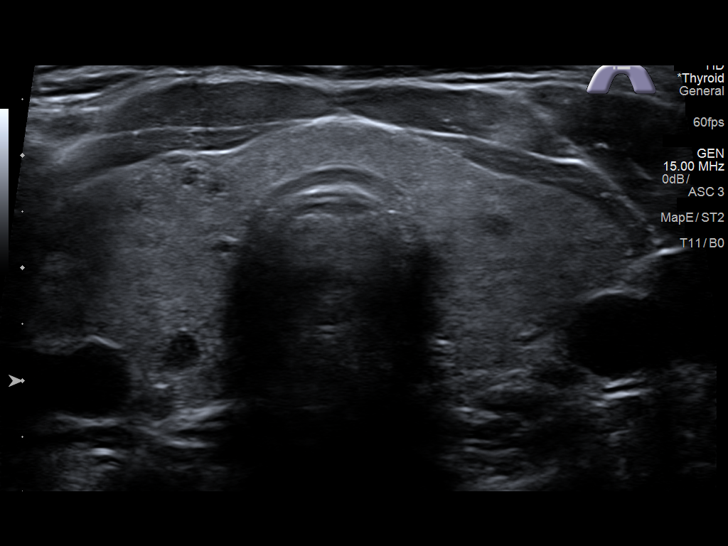
[im 7/82]
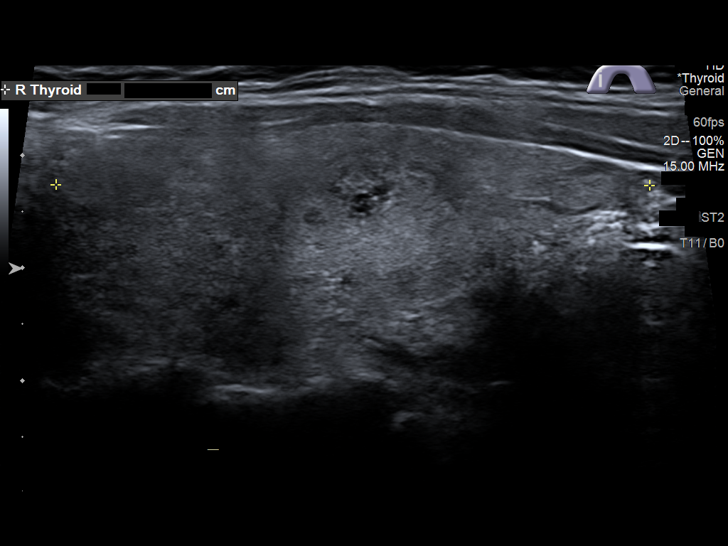
[im 14/82]
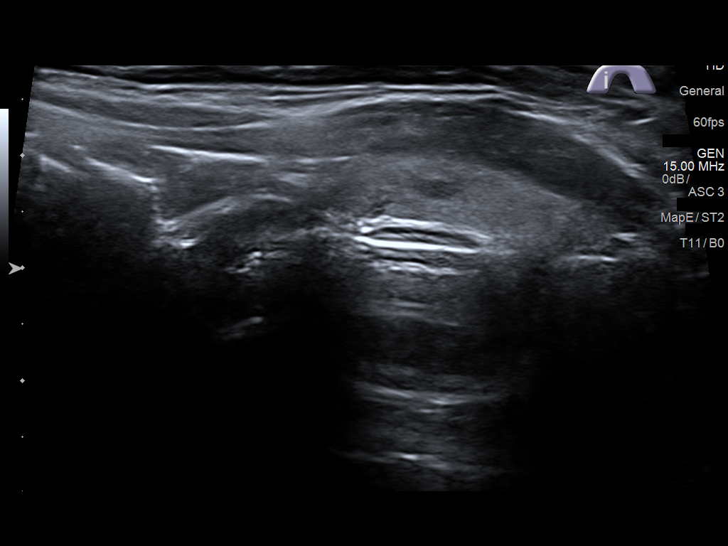
[im 21/82]
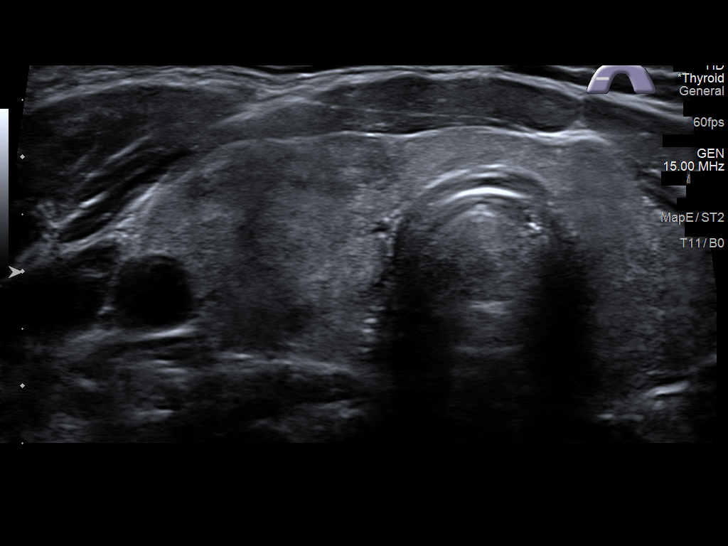
[im 28/82]
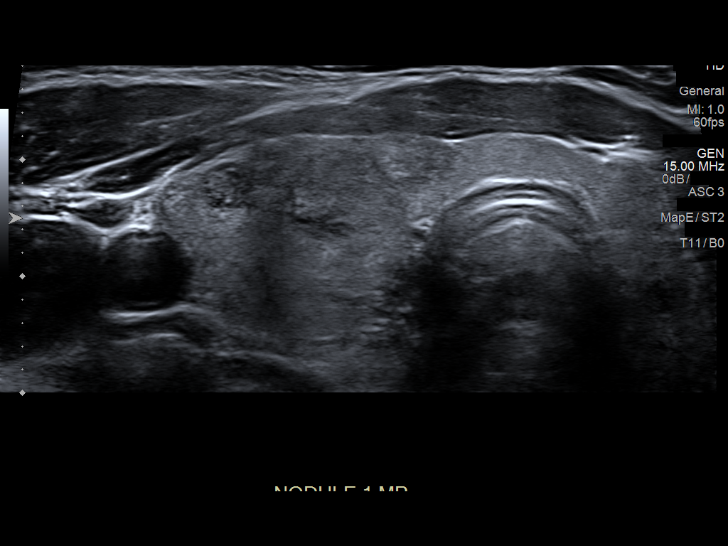
[im 34/82]
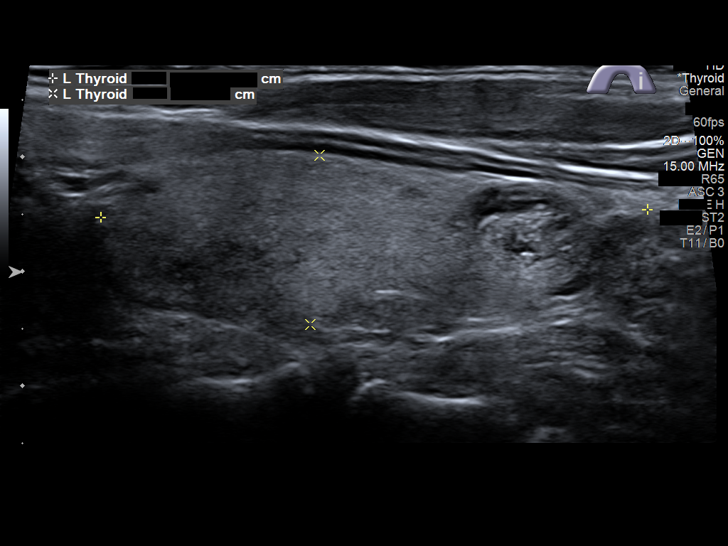
[im 41/82]
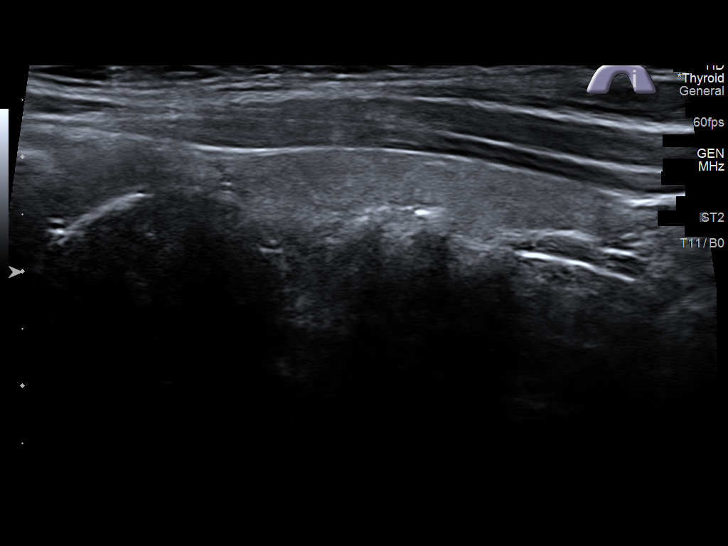
[im 48/82]
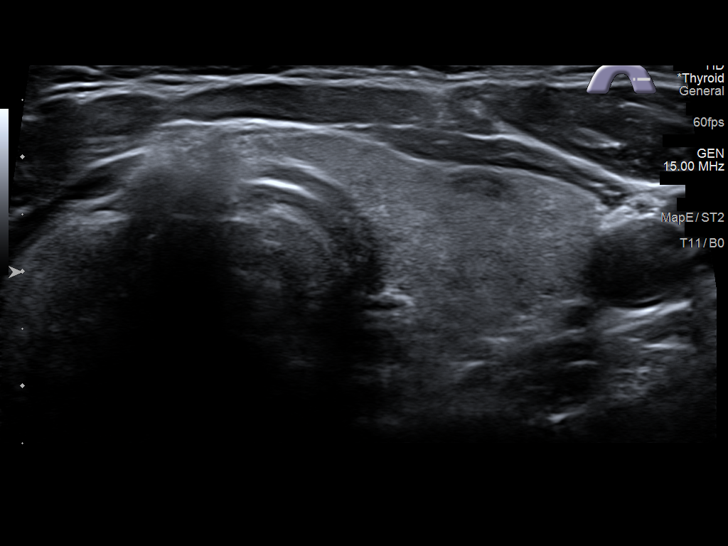
[im 55/82]
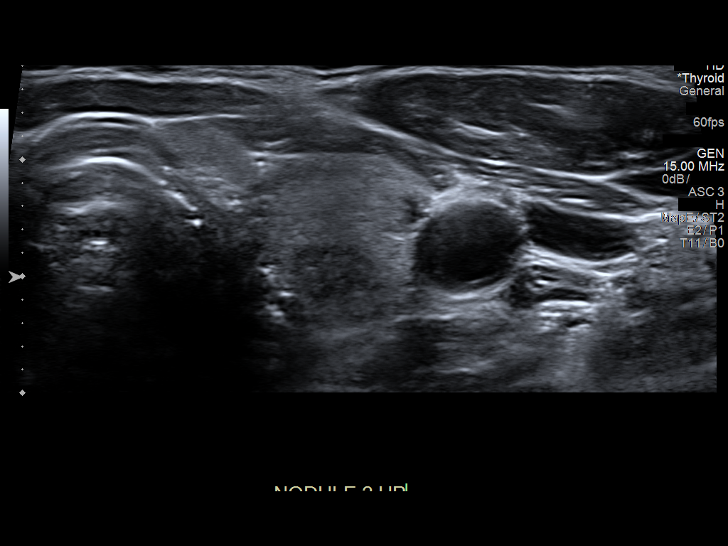
[im 61/82]
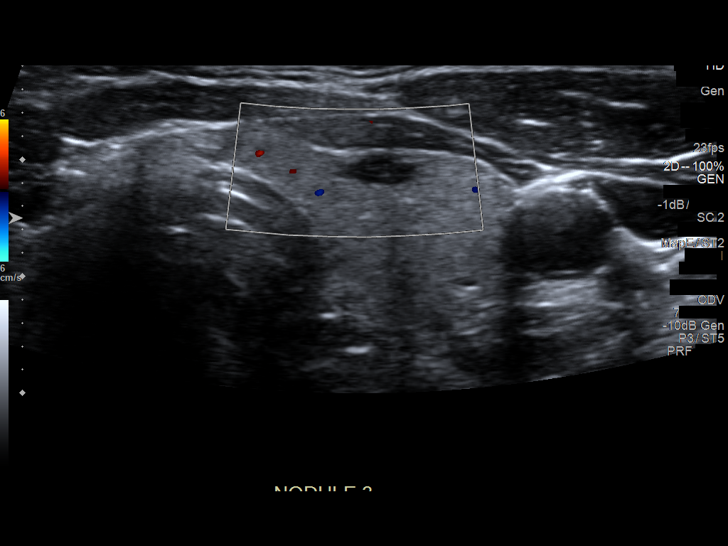
[im 68/82]
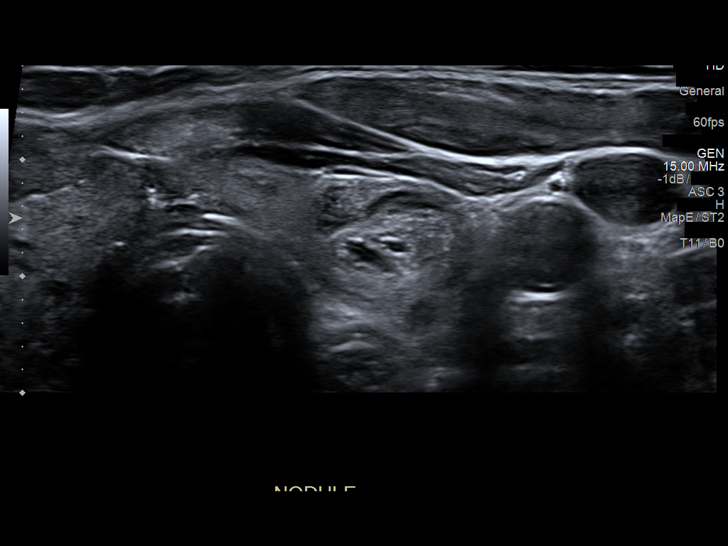
[im 75/82]
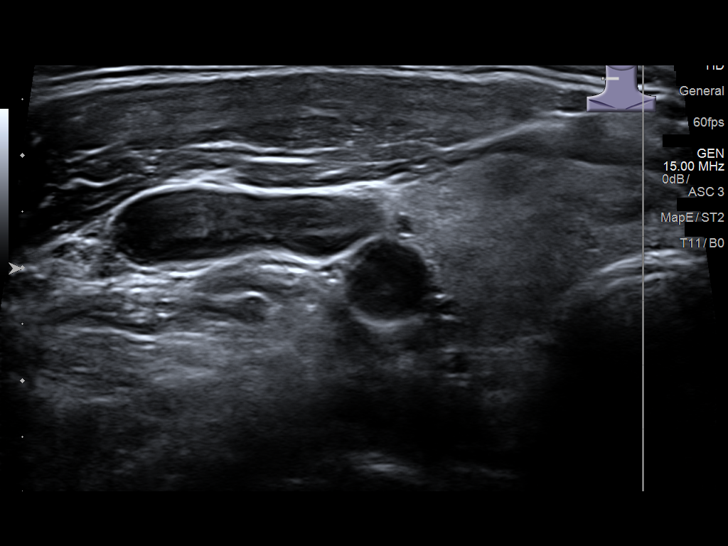
[im 82/82]
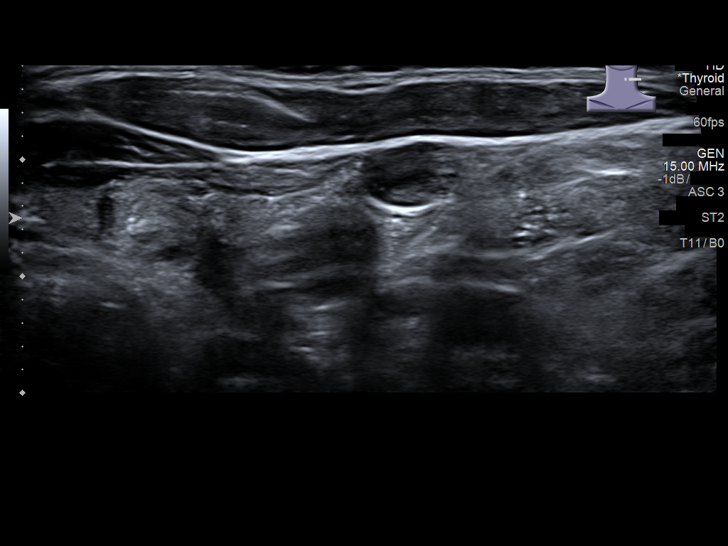

[13 of 25 positions shown; findings below may reference images not displayed]

FINDINGS: Parenchymal Echotexture: Moderately heterogenous

Isthmus: 0.4 cm

Right lobe: 5.3 x 1.9 x 2.2 cm

Left lobe: 4.8 x 1.5 x 2.0 cm

_________________________________________________________

Estimated total number of nodules >/= 1 cm: 3

Number of spongiform nodules >/=  2 cm not described below (TR1): 0

Number of mixed cystic and solid nodules >/= 1.5 cm not described
below (TR2): 0

_________________________________________________________

Right mid lobe pseudo-nodule blends with adjacent tissue. It is
marked as a 2.4 x 2.1 x 1.1 cm nodule. Since it is a pseudo nodule,
it does not meet criteria for biopsy nor follow-up.

Nodule # 3:

Location: Left; Mid

Maximum size: 1.0 cm; Other 2 dimensions: 0.7 x 0.3 cm

Composition: mixed cystic and solid (1)

Echogenicity: hypoechoic (2)

Shape: not taller-than-wide (0)

Margins: smooth (0)

Echogenic foci: none (0)

ACR TI-RADS total points: 3.

ACR TI-RADS risk category: TR3 (3 points).

ACR TI-RADS recommendations:

Given size (<1.4 cm) and appearance, this nodule does NOT meet
TI-RADS criteria for biopsy or dedicated follow-up.

_________________________________________________________

Nodule # 4:

Location: Left; Inferior

Maximum size: 1.9 cm; Other 2 dimensions: 1.4 x 1.0 cm

Composition: solid/almost completely solid (2)

Echogenicity: isoechoic (1)

Shape: not taller-than-wide (0)

Margins: smooth (0)

Echogenic foci: none (0)

ACR TI-RADS total points: 3.

ACR TI-RADS risk category: TR3 (3 points).

ACR TI-RADS recommendations:

*Given size (>/= 1.5 - 2.4 cm) and appearance, a follow-up
ultrasound in 1 year should be considered based on TI-RADS criteria.

_________________________________________________________
IMPRESSION: Left lower pole nodule 4 meets criteria for annual follow-up.

All other nodules do not meet criteria for biopsy nor follow-up.

The above is in keeping with the ACR TI-RADS recommendations - [HOSPITAL] 5308;[DATE].

## 2019-06-29 ENCOUNTER — Encounter: Payer: Self-pay | Admitting: Gastroenterology

## 2019-06-29 ENCOUNTER — Other Ambulatory Visit: Payer: Self-pay

## 2019-06-29 ENCOUNTER — Ambulatory Visit (INDEPENDENT_AMBULATORY_CARE_PROVIDER_SITE_OTHER): Payer: BC Managed Care – PPO | Admitting: Gastroenterology

## 2019-06-29 VITALS — BP 113/73 | HR 103 | Temp 98.6°F | Wt 233.2 lb

## 2019-06-29 DIAGNOSIS — K921 Melena: Secondary | ICD-10-CM

## 2019-06-29 DIAGNOSIS — K648 Other hemorrhoids: Secondary | ICD-10-CM | POA: Diagnosis not present

## 2019-06-29 NOTE — Progress Notes (Signed)
   Jonathon Bellows MD, MRCP(U.K) [address]  [place] [place] [address]  [place],  [address]  Main: [phone]  Fax: [phone]   Primary Care Physician: McLean-Scocuzza, Nino Glow, MD  Primary Gastroenterologist:  Dr. Jonathon Bellows   Chief Complaint  Patient presents with  . Blood In Stools    no blood in stools     HPI: Carmen Cooley is a 53 y.o. female was last seen in my office on 05/11/2019 for blood in the stool for 1 week.  I performed a colonoscopy in February 2020 which showed 2 diminutive polyps that were hyperplastic.  At her last visit she had been having a lot of perianal itching and bright red blood on tissue paper and in the bowl.  No history of constipation.  I performed a flexible sigmoidoscopy on 05/25/2019 and noted no active bleeding but there were small internal hemorrhoids noted about 3 columns.  I discussed with her about conservative management of hemorrhoids.  Since her last visit has not had any further rectal bleeding.  Her only complaint is a bit of perianal itching.  She has adhered to all conservative management recommendations for her internal hemorrhoids.  Current Outpatient Medications  Medication Sig Dispense Refill  . pantoprazole (PROTONIX) 40 MG tablet Take 1 tablet (40 mg total) by mouth daily. 30 minutes before food 90 tablet 1   No current facility-administered medications for this visit.     Allergies as of 06/29/2019 - Review Complete 06/29/2019  Allergen Reaction Noted  . Compazine [prochlorperazine] Shortness Of Breath 06/27/2013  . Contrast media [iodinated diagnostic agents] Hives 06/27/2013    ROS:  General: Negative for anorexia, weight loss, fever, chills, fatigue, weakness. ENT: Negative for hoarseness, difficulty swallowing , nasal congestion. CV: Negative for chest pain, angina, palpitations, dyspnea on exertion, peripheral edema.  Respiratory: Negative for dyspnea at rest, dyspnea on exertion, cough, sputum, wheezing.  GI: See  history of present illness. GU:  Negative for dysuria, hematuria, urinary incontinence, urinary frequency, nocturnal urination.  Endo: Negative for unusual weight change.    Physical Examination:   There were no vitals taken for this visit.  General: Well-nourished, well-developed in no acute distress.  Eyes: No icterus. Conjunctivae pink. Mouth: Oropharyngeal mucosa moist and pink , no lesions erythema or exudate. Lungs: Clear to auscultation bilaterally. Non-labored. Heart: Regular rate and rhythm, no murmurs rubs or gallops.  Abdomen: Bowel sounds are normal, nontender, nondistended, no hepatosplenomegaly or masses, no abdominal bruits or hernia , no rebound or guarding.   Extremities: No lower extremity edema. No clubbing or deformities. Neuro: Alert and oriented x 3.  Grossly intact. Skin: Warm and dry, no jaundice.   Psych: Alert and cooperative, normal mood and affect.   Imaging Studies: No results found.  Assessment and Plan:   Carmen Cooley is a 53 y.o. y/o female is here to follow-up for rectal bleeding likely secondary to internal hemorrhoids.  Responded well to conservative management and has had no rectal bleeding since her last visit.  Her only complaint is a bit of perianal itching.  I suggested to try Desitin topical which will form a barrier between the hemorrhoids and any stool where the bile salts are likely to cause itching.      Dr Jonathon Bellows  MD,MRCP [facility]) Follow up in as needed

## 2019-08-03 ENCOUNTER — Ambulatory Visit
Admission: RE | Admit: 2019-08-03 | Discharge: 2019-08-03 | Disposition: A | Discharge status: Home / Self Care | Payer: BC Managed Care – PPO | Source: Ambulatory Visit | Attending: Internal Medicine | Admitting: Internal Medicine

## 2019-08-03 DIAGNOSIS — Z1231 Encounter for screening mammogram for malignant neoplasm of breast: Secondary | ICD-10-CM | POA: Diagnosis not present

## 2019-09-01 ENCOUNTER — Other Ambulatory Visit: Payer: Self-pay

## 2019-09-04 ENCOUNTER — Other Ambulatory Visit: Payer: BC Managed Care – PPO

## 2019-09-05 ENCOUNTER — Other Ambulatory Visit: Payer: Self-pay

## 2019-09-05 ENCOUNTER — Other Ambulatory Visit (INDEPENDENT_AMBULATORY_CARE_PROVIDER_SITE_OTHER): Payer: BC Managed Care – PPO

## 2019-09-05 DIAGNOSIS — E876 Hypokalemia: Secondary | ICD-10-CM | POA: Diagnosis not present

## 2019-09-05 DIAGNOSIS — E559 Vitamin D deficiency, unspecified: Secondary | ICD-10-CM | POA: Diagnosis not present

## 2019-09-05 DIAGNOSIS — Z13220 Encounter for screening for lipoid disorders: Secondary | ICD-10-CM | POA: Diagnosis not present

## 2019-09-05 DIAGNOSIS — D72829 Elevated white blood cell count, unspecified: Secondary | ICD-10-CM

## 2019-09-06 ENCOUNTER — Other Ambulatory Visit: Payer: Self-pay | Admitting: Internal Medicine

## 2019-09-06 DIAGNOSIS — E559 Vitamin D deficiency, unspecified: Secondary | ICD-10-CM

## 2019-09-06 LAB — CBC WITH DIFFERENTIAL/PLATELET
Basophils Absolute: 0.1 10*3/uL (ref 0.0–0.1)
Basophils Relative: 0.9 % (ref 0.0–3.0)
Eosinophils Absolute: 0.3 10*3/uL (ref 0.0–0.7)
Eosinophils Relative: 2.3 % (ref 0.0–5.0)
HCT: 38.6 % (ref 36.0–46.0)
Hemoglobin: 12.7 g/dL (ref 12.0–15.0)
Lymphocytes Relative: 38 % (ref 12.0–46.0)
Lymphs Abs: 5.5 10*3/uL — ABNORMAL HIGH (ref 0.7–4.0)
MCHC: 32.8 g/dL (ref 30.0–36.0)
MCV: 82.2 fl (ref 78.0–100.0)
Monocytes Absolute: 0.8 10*3/uL (ref 0.1–1.0)
Monocytes Relative: 5.4 % (ref 3.0–12.0)
Neutro Abs: 7.7 10*3/uL (ref 1.4–7.7)
Neutrophils Relative %: 53.4 % (ref 43.0–77.0)
Platelets: 237 10*3/uL (ref 150.0–400.0)
RBC: 4.7 Mil/uL (ref 3.87–5.11)
RDW: 16.1 % — ABNORMAL HIGH (ref 11.5–15.5)
WBC: 14.3 10*3/uL — ABNORMAL HIGH (ref 4.0–10.5)

## 2019-09-06 LAB — LIPID PANEL
Cholesterol: 151 mg/dL (ref 0–200)
HDL: 45.5 mg/dL (ref >39.00)
LDL Cholesterol: 82 mg/dL (ref 0–99)
NonHDL: 105.45
Total CHOL/HDL Ratio: 3
Triglycerides: 118 mg/dL (ref 0.0–149.0)
VLDL: 23.6 mg/dL (ref 0.0–40.0)

## 2019-09-06 LAB — COMPREHENSIVE METABOLIC PANEL
ALT: 13 U/L (ref 0–35)
AST: 12 U/L (ref 0–37)
Albumin: 4.7 g/dL (ref 3.5–5.2)
Alkaline Phosphatase: 68 U/L (ref 39–117)
BUN: 13 mg/dL (ref 6–23)
CO2: 27 mEq/L (ref 19–32)
Calcium: 9.7 mg/dL (ref 8.4–10.5)
Chloride: 103 mEq/L (ref 96–112)
Creatinine, Ser: 0.92 mg/dL (ref 0.40–1.20)
GFR: 76.97 mL/min (ref >60.00)
Glucose, Bld: 100 mg/dL — ABNORMAL HIGH (ref 70–99)
Potassium: 3.4 mEq/L — ABNORMAL LOW (ref 3.5–5.1)
Sodium: 140 mEq/L (ref 135–145)
Total Bilirubin: 0.4 mg/dL (ref 0.2–1.2)
Total Protein: 7.9 g/dL (ref 6.0–8.3)

## 2019-09-06 LAB — VITAMIN D 25 HYDROXY (VIT D DEFICIENCY, FRACTURES): VITD: 19.61 ng/mL — ABNORMAL LOW (ref 30.00–100.00)

## 2019-09-06 MED ORDER — CHOLECALCIFEROL 1.25 MG (50000 UT) PO CAPS: 50000.0000 [IU] | ORAL_CAPSULE | ORAL | 3 refills | Status: DC

## 2019-09-07 ENCOUNTER — Ambulatory Visit (INDEPENDENT_AMBULATORY_CARE_PROVIDER_SITE_OTHER): Payer: BC Managed Care – PPO | Admitting: Internal Medicine

## 2019-09-07 ENCOUNTER — Encounter: Payer: Self-pay | Admitting: Internal Medicine

## 2019-09-07 VITALS — BP 120/82 | Ht 66.0 in | Wt 233.0 lb

## 2019-09-07 DIAGNOSIS — Z0000 Encounter for general adult medical examination without abnormal findings: Secondary | ICD-10-CM

## 2019-09-07 DIAGNOSIS — D72829 Elevated white blood cell count, unspecified: Secondary | ICD-10-CM | POA: Diagnosis not present

## 2019-09-07 DIAGNOSIS — E041 Nontoxic single thyroid nodule: Secondary | ICD-10-CM | POA: Diagnosis not present

## 2019-09-07 DIAGNOSIS — B353 Tinea pedis: Secondary | ICD-10-CM | POA: Insufficient documentation

## 2019-09-07 NOTE — Progress Notes (Signed)
Virtual Visit via Video Note  I connected with Carmen Cooley  on 09/07/19 at  9:30 AM EST by a video enabled telemedicine application and verified that I am speaking with the correct person using two identifiers.  Location patient: truck Location provider:work or home office Persons participating in the virtual visit: patient, provider  I discussed the limitations of evaluation and management by telemedicine and the availability of in person appointments. The patient expressed understanding and agreed to proceed.   HPI: 1. Annual  2. Elevated WBC will refer ho  3. Cracked heels b/l chronically wants referral to podiatry  4. Thyroid nodule and goiter due for repeat thyroid US 11/24/19 and needs to call ENT to sch appt for f/u     ROS: See pertinent positives and negatives per HPI. General: wt stable  HEENT: no sore throat, thyroid enlarging+  CV: no chest pain  Lungs: no sob  GI: No blood in stool GU: no issues  MSK: no pain  Skin: no issues Mood: no depression  Neuro: no h/a    Past Medical History:  Diagnosis Date  . Allergy   . History of blood transfusion   . Migraines   . Seizure ([place])   . UTI (urinary tract infection)     Past Surgical History:  Procedure Laterality Date  . 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 CESAREAN SECTION     x 1  . COLONOSCOPY WITH PROPOFOL N/A 11/16/2018   Procedure: COLONOSCOPY WITH PROPOFOL;  Surgeon: Jonathon Bellows, MD;  Location: [facility] ENDOSCOPY;  Service: Gastroenterology;  Laterality: N/A;  . FLEXIBLE SIGMOIDOSCOPY N/A 05/25/2019   Procedure: FLEXIBLE SIGMOIDOSCOPY;  Surgeon: Jonathon Bellows, MD;  Location[facility] ENDOSCOPY;  Service: Gastroenterology;  Laterality: N/A;  . FOOT SURGERY    . KNEE SURGERY      Family History  Problem Relation Age of Onset  . Diabetes Father   . Cancer Father        sinus   . Hypertension Father   . Cancer Other   . Cancer Mother        pancreatic/whipple/chemo  . Hypertension  Mother   . Cancer Maternal Grandmother        lung smoker  . Cancer Paternal Grandmother        elbow per pt     SOCIAL HX:  4 kids  Single  Some college, truck driver  No guns  Wears seat belt  Safe in relationship   Current Outpatient Medications:  .  Cholecalciferol (VITAMIN D3) 2400 UNIT/ML LIQD, by Does not apply route daily. Patient takes 15 drops under tongue daily., Disp: , Rfl:  .  Cholecalciferol 1.25 MG (50000 UT) capsule, Take 1 capsule (50,000 Units total) by mouth once a week., Disp: 13 capsule, Rfl: 3 .  pantoprazole (PROTONIX) 40 MG tablet, Take 1 tablet (40 mg total) by mouth daily. 30 minutes before food, Disp: 90 tablet, Rfl: 1  EXAM:  VITALS per patient if applicable:  GENERAL: alert, oriented, appears well and in no acute distress  HEENT: atraumatic, conjunttiva clear, no obvious abnormalities on inspection of external nose and ears  NECK: normal movements of the head and neck  LUNGS: on inspection no signs of respiratory distress, breathing rate appears normal, no obvious gross SOB, gasping or wheezing  CV: no obvious cyanosis  MS: moves all visible extremities without noticeable abnormality  PSYCH/NEURO: pleasant and cooperative, no obvious depression or anxiety, speech and thought  processing grossly intact  ASSESSMENT AND PLAN:  Discussed the following assessment and plan:  Annual physical exam Declines flu shot  Tdap per pt had in 2014  declines pna 23, shingrix/covid 19 vaccine  Mammogram 08/03/19 normal   Colonoscopy 11-16-18 hyperplastic polyps f/u 10 years  -flex sig 05/25/19 and colonoscopy 11/16/18   Pap get record [facility] in [place] signed release todayreceived no record -pap need to schedule for future   Smoker rec smoking cessation on and off since age 56 max 1 ppd now 1ppd FH lung cancer  -as of 09/07/19 denies smoking cigs   US thyroid 11/23/18 left lower nodules f/u 1 year pt needs to call ENT for f/u and consider Dr.  Harlow Asa in future to remove thyroid vs ENT  -ordered repeat thyroid US   Eye Dr. Gloriann Loan  Tinea pedis of both feet - Plan: Ambulatory referral to Podiatry  Thyroid nodule - Plan: US THYROID due 11/24/19   Leukocytosis, unspecified type - Plan: Ambulatory referral to Hematology Dr Tasia Catchings further w/u   Given GERD list of foodspreviously   -we discussed possible serious and likely etiologies, options for evaluation and workup, limitations of telemedicine visit vs in person visit, treatment, treatment risks and precautions. Pt prefers to treat via telemedicine empirically rather then risking or undertaking an in person visit at this moment. Patient agrees to seek prompt in person care if worsening, new symptoms arise, or if is not improving with treatment.   I discussed the assessment and treatment plan with the patient. The patient was provided an opportunity to ask questions and all were answered. The patient agreed with the plan and demonstrated an understanding of the instructions.   The patient was advised to call back or seek an in-person evaluation if the symptoms worsen or if the condition fails to improve as anticipated.  Time spent 15 minutes  Delorise Jackson, MD

## 2019-09-07 NOTE — Patient Instructions (Signed)
Dr. Benjamine Mola ENT offices in [place] and [place] # to schedule [phone] -call to schedule referral in 02/2019  For thyroid nodules    Potassium Content of Foods  Potassium is a mineral found in many foods and drinks. It affects how the heart works, and helps keep fluids and minerals balanced in the body. The amount of potassium you need each day depends on your age and any medical conditions you may have. Talk to your health care provider or dietitian about how much potassium you need. The following lists of foods provide the general serving size for foods and the approximate amount of potassium in each serving, listed in milligrams (mg). Actual values may vary depending on the product and how it is processed. High in potassium The following foods and beverages have 200 mg or more of potassium per serving:  Apricots (raw) - 2 have 200 mg of potassium.  Apricots (dry) - 5 have 200 mg of potassium.  Artichoke - 1 medium has 345 mg of potassium.  Avocado -  fruit has 245 mg of potassium.  Banana - 1 medium fruit has 425 mg of potassium.  [place] or baked beans (canned) -  cup has 280 mg of potassium.  White beans (canned) -  cup has 595 mg potassium.  Beef roast - 3 oz has 320 mg of potassium.  Ground beef - 3 oz has 270 mg of potassium.  Beets (raw or cooked) -  cup has 260 mg of potassium.  Bran muffin - 2 oz has 300 mg of potassium.  Broccoli (cooked) -  cup has 230 mg of potassium.  Brussels sprouts -  cup has 250 mg of potassium.  Cantaloupe -  cup has 215 mg of potassium.  Cereal, 100% bran -  cup has 200-400 mg of potassium.  Cheeseburger -1 single fast food burger has 225-400 mg of potassium.  Chicken - 3 oz has 220 mg of potassium.  Clams (canned) - 3 oz has 535 mg of potassium.  Crab - 3 oz has 225 mg of potassium.  Dates - 5 have 270 mg of potassium.  Dried beans and peas -  cup has 300-475 mg of potassium.  Figs (dried) - 2 have 260 mg of  potassium.  Fish (halibut, tuna, cod, snapper) - 3 oz has 480 mg of potassium.  Fish (salmon, haddock, swordfish, perch) - 3 oz has 300 mg of potassium.  Fish (tuna, canned) - 3 oz has 200 mg of potassium.  [place] fries (fast food) - 3 oz has 470 mg of potassium.  Granola with fruit and nuts -  cup has 200 mg of potassium.  Grapefruit juice -  cup has 200 mg of potassium.  Honeydew melon -  cup has 200 mg of potassium.  Kale (raw) - 1 cup has 300 mg of potassium.  Kiwi - 1 medium fruit has 240 mg of potassium.  Kohlrabi, rutabaga, parsnips -  cup has 280 mg of potassium.  Lentils -  cup has 365 mg of potassium.  Mango - 1 each has 325 mg of potassium.  Milk (nonfat, low-fat, whole, buttermilk) - 1 cup has 350-380 mg of potassium.  Milk (chocolate) - 1 cup has 420 mg of potassium  Molasses - 1 Tbsp has 295 mg of potassium.  Mushrooms -  cup has 280 mg of potassium.  Nectarine - 1 each has 275 mg of potassium.  Nuts (almonds, peanuts, hazelnuts, [place], cashew, mixed) - 1 oz has 200 mg  of potassium.  Nuts (pistachios) - 1 oz has 295 mg of potassium.  Orange - 1 fruit has 240 mg of potassium.  Orange juice -  cup has 235 mg of potassium.  Papaya -  medium fruit has 390 mg of potassium.  Peanut butter (chunky) - 2 Tbsp has 240 mg of potassium.  Peanut butter (smooth) - 2 Tbsp has 210 mg of potassium.  Pear - 1 medium (200 mg) of potassium.  Pomegranate - 1 whole fruit has 400 mg of potassium.  Pomegranate juice -  cup has 215 mg of potassium.  Pork - 3 oz has 350 mg of potassium.  Potato chips (salted) - 1 oz has 465 mg of potassium.  Potato (baked with skin) - 1 medium has 925 mg of potassium.  Potato (boiled) -  cup has 255 mg of potassium.  Potato (Mashed) -  cup has 330 mg of potassium.  Prune juice -  cup has 370 mg of potassium.  Prunes - 5 have 305 mg of potassium.  Pudding (chocolate) -  cup has 230 mg of potassium.  Pumpkin  (canned) -  cup has 250 mg of potassium.  Raisins (seedless) -  cup has 270 mg of potassium.  Seeds (sunflower or pumpkin) - 1 oz has 240 mg of potassium.  Soy milk - 1 cup has 300 mg of potassium.  Spinach (cooked) - 1/2 cup has 420 mg of potassium.  Spinach (canned) -  cup has 370 mg of potassium.  Sweet potato (baked with skin) - 1 medium has 450 mg of potassium.  Swiss chard -  cup has 480 mg of potassium.  Tomato or vegetable juice -  cup has 275 mg of potassium.  Tomato (sauce or puree) -  cup has 400-550 mg of potassium.  Tomato (raw) - 1 medium has 290 mg of potassium.  Tomato (canned) -  cup has 200-300 mg of potassium.  [place] - 3 oz has 250 mg of potassium.  Wheat germ - 1 oz has 250 mg of potassium.  Winter squash -  cup has 250 mg of potassium.  Yogurt (plain or fruited) - 6 oz has 260-435 mg of potassium.  Zucchini -  cup has 220 mg of potassium. Moderate in potassium The following foods and beverages have 50-200 mg of potassium per serving:  Apple - 1 fruit has 150 mg of potassium  Apple juice -  cup has 150 mg of potassium  Applesauce -  cup has 90 mg of potassium  Apricot nectar -  cup has 140 mg of potassium  Asparagus (small spears) -  cup has 155 mg of potassium  Asparagus (large spears) - 6 have 155 mg of potassium  Bagel (cinnamon raisin) - 1 four-inch bagel has 130 mg of potassium  Bagel (egg or plain) - 1 four- inch bagel has 70 mg of potassium  Beans (green) -  cup has 90 mg of potassium  Beans (yellow) -  cup has 190 mg of potassium  Beer, regular - 12 oz has 100 mg of potassium  Beets (canned) -  cup has 125 mg of potassium  Blackberries -  cup has 115 mg of potassium  Blueberries -  cup has 60 mg of potassium  Bread (whole wheat) - 1 slice has 70 mg of potassium  Broccoli (raw) -  cup has 145 mg of potassium  Cabbage -  cup has 150 mg of potassium  Carrots (cooked or raw) -  cup has 180  mg of  potassium  Cauliflower (raw) -  cup has 150 mg of potassium  Celery (raw) -  cup has 155 mg of potassium  Cereal, bran flakes -  cup has 120-150 mg of potassium  Cheese (cottage) -  cup has 110 mg of potassium  Cherries - 10 have 150 mg of potassium  Chocolate - 1 oz bar has 165 mg of potassium  Coffee (brewed) - 6 oz has 90 mg of potassium  Corn -  cup or 1 ear has 195 mg of potassium  Cucumbers -  cup has 80 mg of potassium  Egg - 1 large egg has 60 mg of potassium  Eggplant -  cup has 60 mg of potassium  Endive (raw) -  cup has 80 mg of potassium  English muffin - 1 has 65 mg of potassium  Fish (ocean perch) - 3 oz has 192 mg of potassium  Frankfurter, beef or pork - 1 has 75 mg of potassium  Fruit cocktail -  cup has 115 mg of potassium  Grape juice -  cup has 170 mg of potassium  Grapefruit -  fruit has 175 mg of potassium  Grapes -  cup has 155 mg of potassium  Greens: kale, turnip, collard -  cup has 110-150 mg of potassium  Ice cream or frozen yogurt (chocolate) -  cup has 175 mg of potassium  Ice cream or frozen yogurt (vanilla) -  cup has 120-150 mg of potassium  Lemons, limes - 1 each has 80 mg of potassium  Lettuce - 1 cup has 100 mg of potassium  Mixed vegetables -  cup has 150 mg of potassium  Mushrooms, raw -  cup has 110 mg of potassium  Nuts (walnuts, pecans, or macadamia) - 1 oz has 125 mg of potassium  Oatmeal -  cup has 80 mg of potassium  Okra -  cup has 110 mg of potassium  Onions -  cup has 120 mg of potassium  Peach - 1 has 185 mg of potassium  Peaches (canned) -  cup has 120 mg of potassium  Pears (canned) -  cup has 120 mg of potassium  Peas, green (frozen) -  cup has 90 mg of potassium  Peppers (Green) -  cup has 130 mg of potassium  Peppers (Red) -  cup has 160 mg of potassium  Pineapple juice -  cup has 165 mg of potassium  Pineapple (fresh or canned) -  cup has 100 mg of potassium   Plums - 1 has 105 mg of potassium  Pudding, vanilla -  cup has 150 mg of potassium  Raspberries -  cup has 90 mg of potassium  Rhubarb -  cup has 115 mg of potassium  Rice, wild -  cup has 80 mg of potassium  Shrimp - 3 oz has 155 mg of potassium  Spinach (raw) - 1 cup has 170 mg of potassium  Strawberries -  cup has 125 mg of potassium  Summer squash -  cup has 175-200 mg of potassium  Swiss chard (raw) - 1 cup has 135 mg of potassium  Tangerines - 1 fruit has 140 mg of potassium  Tea, brewed - 6 oz has 65 mg of potassium  Turnips -  cup has 140 mg of potassium  Watermelon -  cup has 85 mg of potassium  Wine (Red, table) - 5 oz has 180 mg of potassium  Wine (White, table) - 5 oz 100 mg of potassium  Low in potassium The following foods and beverages have less than 50 mg of potassium per serving.  Bread (white) - 1 slice has 30 mg of potassium  Carbonated beverages - 12 oz has less than 5 mg of potassium  Cheese - 1 oz has 20-30 mg of potassium  Cranberries -  cup has 45 mg of potassium  Cranberry juice cocktail -  cup has 20 mg of potassium  Fats and oils - 1 Tbsp has less than 5 mg of potassium  Hummus - 1 Tbsp has 32 mg of potassium  Nectar (papaya, mango, or pear) -  cup has 35 mg of potassium  Rice (white or brown) -  cup has 50 mg of potassium  Spaghetti or macaroni (cooked) -  cup has 30 mg of potassium  Tortilla, flour or corn - 1 has 50 mg of potassium  Waffle - 1 four-inch waffle has 50 mg of potassium  Water chestnuts -  cup has 40 mg of potassium Summary  Potassium is a mineral found in many foods and drinks. It affects how the heart works, and helps keep fluids and minerals balanced in the body.  The amount of potassium you need each day depends on your age and any existing medical conditions you may have. Your health care provider or dietitian may recommend an amount of potassium that you should have each day. This information  is not intended to replace advice given to you by your health care provider. Make sure you discuss any questions you have with your health care provider. Document Released: 04/28/2005 Document Revised: 08/27/2017 Document Reviewed: 12/09/2016 Elsevier Patient Education  [place].  Leukocytosis Leukocytosis means that a person has more white blood cells than normal. White blood cells are made in the bone marrow. Bone marrow is the spongy tissue inside bones. The main job of white blood cells is to fight infection. Having too many white blood cells is a common condition. It can develop as a result of many types of medical problems. What are the causes? Leukocytosis may be caused by various conditions. In some cases, the bone marrow is normal but is still making too many white blood cells. This can be due to:  Infection.  Injury.  Physical stress.  Emotional stress.  Surgery.  Allergic reactions.  Tumors that do not start in the blood or bone marrow.  An inherited disease.  Certain medicines.  Pregnancy and labor. In other cases, a person may have a bone marrow disorder that is causing the body to make too many white blood cells. Bone marrow disorders include:  Leukemia. This is a type of blood cancer.  Myeloproliferative disorders. These disorders cause blood cells to grow abnormally. What are the signs or symptoms? Often, this condition causes no symptoms. Some people may have symptoms due to the medical condition that is causing their leukocytosis. These symptoms may include:  Bleeding.  Bruising.  Fever.  Night sweats.  Swollen lymph nodes.  An enlarged spleen.  Repeated infections.  Weakness.  Weight loss. How is this diagnosed? This condition is diagnosed with blood tests. It is often found when blood is tested as part of a routine physical exam. You may have other tests to help determine why you have too many white blood cells. These tests may  include:  A complete blood count (CBC). This test measures all the types of blood cells in your body.  Chest X-rays, urine tests, or other tests to look for signs of  infection.  Bone marrow aspiration. For this test, a needle is put into your bone. Cells from the bone marrow are removed through the needle and examined under a microscope.  Other tests on the blood or bone marrow sample.  CT scan, bone scan, or other imaging tests. How is this treated? Usually, treatment is not needed for leukocytosis. However, if an infection, cancer, bone marrow disorder, or other serious problem is causing your leukocytosis, it will need to be treated. Treatment may include:  Regular monitoring of your white blood cell count to look for changes.  Antibiotic medicine if you have a bacterial infection.  Bone marrow transplant. This treatment replaces your diseased bone marrow with healthy cells that will grow new bone marrow.  Chemotherapy or biological therapies such as the use of antibodies. These treatments may be used to kill cancer cells or to decrease the number of white blood cells. Follow these instructions at home: Medicines  Take over-the-counter and prescription medicines only as told by your health care provider.  If you were prescribed an antibiotic medicine, take it as told by your health care provider. Do not stop taking the antibiotic even if you start to feel better. Eating and drinking   Eat foods that are low in saturated fats and high in fiber. Eat plenty of fruits and vegetables.  Drink enough fluid to keep your urine pale yellow.  Limit your intake of caffeine and alcohol. General instructions  Maintain a healthy weight. Ask your health care provider what weight is best for you.  Do 30 minutes of exercise at least 5 times each week. Check with your health care provider before you start a new exercise routine.  Follow any safety precautions as told by your health care  provider. This may be needed if you are at increased risk for infection or bleeding because of your condition.  Do not use any products that contain nicotine or tobacco, such as cigarettes, e-cigarettes, and chewing tobacco. If you need help quitting, ask your health care provider.  Keep all follow-up visits as told by your health care provider. This is important. Contact a health care provider if you:  Feel weak or more tired than usual.  Develop chills, a cough, or nasal congestion.  Have a fever.  Lose weight without trying.  Have night sweats.  Bruise easily.  Have new or worsening symptoms. Get help right away if you:  Bleed more than normal.  Have chest pain.  Have trouble breathing.  Have uncontrolled nausea or vomiting.  Feel dizzy or light-headed. Summary  Leukocytosis means that a person has more white blood cells than normal.  This condition often causes no symptoms.  This condition may be caused by various conditions.  If an infection, cancer, bone marrow disorder, or other serious problem is causing your leukocytosis, it will need to be treated.  Keep all follow-up visits as told by your health care provider. This is important. This information is not intended to replace advice given to you by your health care provider. Make sure you discuss any questions you have with your health care provider. Document Released: 09/03/2011 Document Revised: 06/09/2018 Document Reviewed: 06/09/2018 Elsevier Patient Education  2020 [employer].

## 2019-09-12 ENCOUNTER — Other Ambulatory Visit: Payer: Self-pay

## 2019-09-12 ENCOUNTER — Ambulatory Visit (INDEPENDENT_AMBULATORY_CARE_PROVIDER_SITE_OTHER): Payer: BC Managed Care – PPO | Admitting: Podiatry

## 2019-09-12 ENCOUNTER — Encounter: Payer: Self-pay | Admitting: Podiatry

## 2019-09-12 VITALS — BP 125/79

## 2019-09-12 DIAGNOSIS — L853 Xerosis cutis: Secondary | ICD-10-CM | POA: Diagnosis not present

## 2019-09-12 DIAGNOSIS — M79675 Pain in left toe(s): Secondary | ICD-10-CM

## 2019-09-12 DIAGNOSIS — B351 Tinea unguium: Secondary | ICD-10-CM | POA: Diagnosis not present

## 2019-09-12 DIAGNOSIS — M79674 Pain in right toe(s): Secondary | ICD-10-CM | POA: Diagnosis not present

## 2019-09-12 MED ORDER — AMMONIUM LACTATE 12 % EX LOTN: 1.0000 "application " | TOPICAL_LOTION | CUTANEOUS | 0 refills | Status: DC | PRN

## 2019-09-14 ENCOUNTER — Encounter: Payer: Self-pay | Admitting: Podiatry

## 2019-09-14 LAB — HEPATIC FUNCTION PANEL
ALT: 12 IU/L (ref 0–32)
AST: 9 IU/L (ref 0–40)
Albumin: 4.4 g/dL (ref 3.8–4.9)
Alkaline Phosphatase: 75 IU/L (ref 39–117)
Bilirubin Total: 0.2 mg/dL (ref 0.0–1.2)
Bilirubin, Direct: 0.08 mg/dL (ref 0.00–0.40)
Total Protein: 7.4 g/dL (ref 6.0–8.5)

## 2019-09-14 MED ORDER — TERBINAFINE HCL 250 MG PO TABS: 250.0000 mg | ORAL_TABLET | Freq: Every day | ORAL | 0 refills | Status: DC

## 2019-09-14 NOTE — Progress Notes (Signed)
Subjective:  Patient ID: Carmen Cooley, female    DOB: 11/15/1965,  MRN: [id number]  Chief Complaint  Patient presents with  . Nail Problem    pt is here for a bil toenail fungus, pt states that pain is elevated when walking, pt also states that it has been going on since COVID-19 started    53 y.o. female presents with the above complaint.  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  Patient states that both of this has been going on since March especially the fungus.  Patient states it sometimes hurts.  Patient has tried doing debridement by it upon herself with the at home nail cutter but has not helped.  She states that there is some burning pain associated with it.  She also has a secondary complaint of bilateral heel fissures that have happened over time.  She is constantly on her feet.  She has not scratch that she has tried multiple different over-the-counter dry skin lotion but has not helped.  She would like to know if there is anything else that could be done.  Because when the fissures crack it really causes her a lot of pain.   Review of Systems: Negative except as noted in the HPI. Denies N/V/F/Ch.  Past Medical History:  Diagnosis Date  . Allergy   . History of blood transfusion   . Migraines   . Seizure ([place])   . UTI (urinary tract infection)     Current Outpatient Medications:  .  Cholecalciferol (VITAMIN D3) 2400 UNIT/ML LIQD, by Does not apply route daily. Patient takes 15 drops under tongue daily., Disp: , Rfl:  .  Cholecalciferol 1.25 MG (50000 UT) capsule, Take 1 capsule (50,000 Units total) by mouth once a week., Disp: 13 capsule, Rfl: 3 .  pantoprazole (PROTONIX) 40 MG tablet, Take 1 tablet (40 mg total) by mouth daily. 30 minutes before food, Disp: 90 tablet, Rfl: 1 .  ammonium lactate (AMLACTIN) 12 % lotion, Apply 1 application topically as needed for dry skin., Disp: 400 g, Rfl:  0  Social History   Tobacco Use  Smoking Status Former Smoker  . Packs/day: 0.50  Smokeless Tobacco Never Used    Allergies  Allergen Reactions  . Compazine [Prochlorperazine] Shortness Of Breath  . Contrast Media [Iodinated Diagnostic Agents] Hives   Objective:   Vitals:   09/12/19 1342  BP: 125/79   There is no height or weight on file to calculate BMI. Constitutional Well developed. Well nourished.  Vascular Dorsalis pedis pulses palpable bilaterally. Posterior tibial pulses palpable bilaterally. Capillary refill normal to all digits.  No cyanosis or clubbing noted. Pedal hair growth normal.  Neurologic Normal speech. Oriented to person, place, and time. Epicritic sensation to light touch grossly present bilaterally.  Dermatologic  multiple fissures noted across bilateral plantar heel.  In winter, the fissures cracked deeply and causes her pain/bleeding.  None is noted today  Bilateral toenails x10 onychomycosis with thick elongated discolored mycotic toenails noted.  Orthopedic: Normal joint ROM without pain or crepitus bilaterally. No visible deformities. No bony tenderness.   Radiographs: None Assessment:   1. Nail fungus   2. Xerosis cutis   3. Pain Cooley to onychomycosis of toenails of both feet    Plan:  Patient was evaluated and treated and all questions answered.  Bilateral onychomycosis x10 -I explained to the patient the etiology and various treatment options associated for onychomycosis including p.o.,  topical, laser therapy.  Patient would like to undergo p.o. option.  I explained to the patient that I will need to obtain a liver function test prior to starting her on Lamisil therapy.  Once the liver function test comes back normal I will dispense Lamisil therapy.  Bilateral severe xerosis with fissures heels -I explained to the patient the etiology of fissures with underlying severe xerosis.  I explained to her that moisturization is one of the best  therapies to help with this especially during wintertime.  I will dispense ammonium lactate to help aggressively prevent severe xerosis. -AmLactin lotion was dispensed to pharmacy.  No follow-ups on file.

## 2019-09-18 ENCOUNTER — Other Ambulatory Visit: Payer: Self-pay

## 2019-09-18 ENCOUNTER — Encounter: Payer: Self-pay | Admitting: Oncology

## 2019-09-18 ENCOUNTER — Inpatient Hospital Stay: Payer: BC Managed Care – PPO

## 2019-09-18 ENCOUNTER — Inpatient Hospital Stay: Payer: BC Managed Care – PPO | Attending: Oncology | Admitting: Oncology

## 2019-09-18 VITALS — BP 123/77 | HR 90 | Temp 98.4°F | Resp 16 | Wt 231.0 lb

## 2019-09-18 DIAGNOSIS — B351 Tinea unguium: Secondary | ICD-10-CM

## 2019-09-18 DIAGNOSIS — Z79899 Other long term (current) drug therapy: Secondary | ICD-10-CM | POA: Insufficient documentation

## 2019-09-18 DIAGNOSIS — R5382 Chronic fatigue, unspecified: Secondary | ICD-10-CM | POA: Insufficient documentation

## 2019-09-18 DIAGNOSIS — E559 Vitamin D deficiency, unspecified: Secondary | ICD-10-CM

## 2019-09-18 DIAGNOSIS — Z800 Family history of malignant neoplasm of digestive organs: Secondary | ICD-10-CM | POA: Diagnosis not present

## 2019-09-18 DIAGNOSIS — Z87891 Personal history of nicotine dependence: Secondary | ICD-10-CM | POA: Diagnosis not present

## 2019-09-18 DIAGNOSIS — Z833 Family history of diabetes mellitus: Secondary | ICD-10-CM | POA: Insufficient documentation

## 2019-09-18 DIAGNOSIS — Z8249 Family history of ischemic heart disease and other diseases of the circulatory system: Secondary | ICD-10-CM | POA: Diagnosis not present

## 2019-09-18 DIAGNOSIS — Z801 Family history of malignant neoplasm of trachea, bronchus and lung: Secondary | ICD-10-CM | POA: Diagnosis not present

## 2019-09-18 DIAGNOSIS — D72820 Lymphocytosis (symptomatic): Secondary | ICD-10-CM | POA: Diagnosis present

## 2019-09-18 DIAGNOSIS — D72829 Elevated white blood cell count, unspecified: Secondary | ICD-10-CM

## 2019-09-18 LAB — CBC WITH DIFFERENTIAL/PLATELET
Abs Immature Granulocytes: 0.04 10*3/uL (ref 0.00–0.07)
Basophils Absolute: 0.1 10*3/uL (ref 0.0–0.1)
Basophils Relative: 0 %
Eosinophils Absolute: 0.4 10*3/uL (ref 0.0–0.5)
Eosinophils Relative: 3 %
HCT: 40.8 % (ref 36.0–46.0)
Hemoglobin: 12.6 g/dL (ref 12.0–15.0)
Immature Granulocytes: 0 %
Lymphocytes Relative: 41 %
Lymphs Abs: 7 10*3/uL — ABNORMAL HIGH (ref 0.7–4.0)
MCH: 26.1 pg (ref 26.0–34.0)
MCHC: 30.9 g/dL (ref 30.0–36.0)
MCV: 84.6 fL (ref 80.0–100.0)
Monocytes Absolute: 1 10*3/uL (ref 0.1–1.0)
Monocytes Relative: 6 %
Neutro Abs: 8.4 10*3/uL — ABNORMAL HIGH (ref 1.7–7.7)
Neutrophils Relative %: 50 %
Platelets: 290 10*3/uL (ref 150–400)
RBC: 4.82 MIL/uL (ref 3.87–5.11)
RDW: 16.2 % — ABNORMAL HIGH (ref 11.5–15.5)
WBC: 17 10*3/uL — ABNORMAL HIGH (ref 4.0–10.5)
nRBC: 0 % (ref 0.0–0.2)

## 2019-09-18 LAB — TECHNOLOGIST SMEAR REVIEW: Plt Morphology: ADEQUATE

## 2019-09-18 NOTE — Progress Notes (Signed)
Hematology/Oncology Consult note Carmen Cooley [phone] [phone]   Patient Care Team: McLean-Scocuzza, Nino Glow, MD as PCP - General (Internal Medicine)  REFERRING PROVIDER: McLean-Scocuzza, Nino Glow, MD  CHIEF COMPLAINTS/REASON FOR VISIT:  Evaluation of leukocytosis  HISTORY OF PRESENTING ILLNESS:  Carmen Cooley is a  53 y.o.  female with PMH listed below who was referred to me for evaluation of leukocytosis Reviewed patient' recent labs obtained by PCP.  09/05/2019 CBC showed elevated white count of 14.3, predominantly lymphocytes.  Absolute lymphocyte 5.5. Previous lab records reviewed. Leukocytosis onset of chronic, duration is since 2013.  Chronic lymphocytosis with absolute lymphocyte ranging from 3.8-6.2.  No aggravating or elevated factors. Associated symptoms or signs:  Denies weight loss, fever, chills,night sweats.  Chronic fatigue Smoking history: Former smoker.  Stopped smoking approximately 6 months ago. History of recent oral steroid use or steroid injection: Denies History of recent infection: Denies Autoimmune disease history.  Denies  Denies any IUD device.  Review of Systems  Constitutional: Negative for appetite change, chills, fatigue and fever.  HENT:   Negative for hearing loss and voice change.   Eyes: Negative for eye problems.  Respiratory: Negative for chest tightness and cough.   Cardiovascular: Negative for chest pain.  Gastrointestinal: Negative for abdominal distention, abdominal pain and blood in stool.  Endocrine: Negative for hot flashes.  Genitourinary: Negative for difficulty urinating and frequency.   Musculoskeletal: Negative for arthralgias.  Skin: Negative for itching and rash.  Neurological: Negative for extremity weakness.  Hematological: Negative for adenopathy.  Psychiatric/Behavioral: Negative for confusion.     MEDICAL HISTORY:  Past Medical History:  Diagnosis Date  . Allergy     . History of blood transfusion   . Migraines   . Seizure ([place])   . UTI (urinary tract infection)     SURGICAL HISTORY: Past Surgical History:  Procedure Laterality Date  . 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 CESAREAN SECTION     x 1  . COLONOSCOPY WITH PROPOFOL N/A 11/16/2018   Procedure: COLONOSCOPY WITH PROPOFOL;  Surgeon: Jonathon Bellows, MD;  Location: [facility] ENDOSCOPY;  Service: Gastroenterology;  Laterality: N/A;  . FLEXIBLE SIGMOIDOSCOPY N/A 05/25/2019   Procedure: FLEXIBLE SIGMOIDOSCOPY;  Surgeon: Jonathon Bellows, MD;  Location: [facility] ENDOSCOPY;  Service: Gastroenterology;  Laterality: N/A;  . FOOT SURGERY    . KNEE SURGERY      SOCIAL HISTORY: Social History   Socioeconomic History  . Marital status: Single    Spouse name: Not on file  . Number of children: Not on file  . Years of education: Not on file  . Highest education level: Not on file  Occupational History  . Not on file  Tobacco Use  . Smoking status: Former Smoker    Packs/day: 0.50    Quit date: 09/28/2018    Years since quitting: 0.9  . Smokeless tobacco: Never Used  Substance and Sexual Activity  . Alcohol use: Not Currently    Comment: occasional  . Drug use: No  . Sexual activity: Not Currently  Other Topics Concern  . Not on file  Social History Narrative   4 kids    Single    Some college, truck driver    No guns    Wears seat belt    Safe in relationship   Truck driver for [employer]    Social Determinants of Health   Financial Resource Strain:   . Difficulty  of Paying Living Expenses: Not on file  Food Insecurity:   . Worried About Charity fundraiser in the Last Year: Not on file  . Ran Out of Food in the Last Year: Not on file  Transportation Needs:   . Lack of Transportation (Medical): Not on file  . Lack of Transportation (Non-Medical): Not on file  Physical Activity:   . Days of Exercise per Week: Not on file  . Minutes of Exercise per Session: Not on file   Stress:   . Feeling of Stress : Not on file  Social Connections:   . Frequency of Communication with Friends and Family: Not on file  . Frequency of Social Gatherings with Friends and Family: Not on file  . Attends Religious Services: Not on file  . Active Member of Clubs or Organizations: Not on file  . Attends Archivist Meetings: Not on file  . Marital Status: Not on file  Intimate Partner Violence:   . Fear of Current or Ex-Partner: Not on file  . Emotionally Abused: Not on file  . Physically Abused: Not on file  . Sexually Abused: Not on file    FAMILY HISTORY: Family History  Problem Relation Age of Onset  . Diabetes Father   . Cancer Father        sinus   . Hypertension Father   . Cancer Other   . Cancer Mother        pancreatic/whipple/chemo  . Hypertension Mother   . Cancer Maternal Grandmother        lung smoker  . Cancer Paternal Grandmother        elbow per pt     ALLERGIES:  is allergic to compazine [prochlorperazine] and contrast media [iodinated diagnostic agents].  MEDICATIONS:  Current Outpatient Medications  Medication Sig Dispense Refill  . ammonium lactate (AMLACTIN) 12 % lotion Apply 1 application topically as needed for dry skin. 400 g 0  . Cholecalciferol (VITAMIN D3) 2400 UNIT/ML LIQD by Does not apply route daily. Patient takes 15 drops under tongue daily.    . Cholecalciferol 1.25 MG (50000 UT) capsule Take 1 capsule (50,000 Units total) by mouth once a week. 13 capsule 3  . pantoprazole (PROTONIX) 40 MG tablet Take 1 tablet (40 mg total) by mouth daily. 30 minutes before food 90 tablet 1  . terbinafine (LAMISIL) 250 MG tablet Take 1 tablet (250 mg total) by mouth daily. 90 tablet 0   No current facility-administered medications for this visit.     PHYSICAL EXAMINATION: ECOG PERFORMANCE STATUS: 0 - Asymptomatic Vitals:   09/18/19 1451  BP: 123/77  Pulse: 90  Resp: 16  Temp: 98.4 F (36.9 C)  SpO2: 96%   Filed Weights    09/18/19 1451  Weight: 231 lb (104.8 kg)    Physical Exam Constitutional:      General: She is not in acute distress.    Appearance: She is obese.  HENT:     Head: Normocephalic and atraumatic.  Eyes:     General: No scleral icterus.    Pupils: Pupils are equal, round, and reactive to light.  Cardiovascular:     Rate and Rhythm: Normal rate and regular rhythm.     Heart sounds: Normal heart sounds.  Pulmonary:     Effort: Pulmonary effort is normal. No respiratory distress.     Breath sounds: No wheezing.  Abdominal:     General: Bowel sounds are normal. There is no distension.  Palpations: Abdomen is soft. There is no mass.     Tenderness: There is no abdominal tenderness.  Musculoskeletal:        General: No deformity. Normal range of motion.     Cervical back: Normal range of motion and neck supple.  Skin:    General: Skin is warm and dry.     Findings: No erythema or rash.  Neurological:     Mental Status: She is alert and oriented to person, place, and time.     Cranial Nerves: No cranial nerve deficit.     Coordination: Coordination normal.  Psychiatric:        Behavior: Behavior normal.        Thought Content: Thought content normal.     CMP Latest Ref Rng & Units 09/12/2019  Glucose 70 - 99 mg/dL -  BUN 6 - 23 mg/dL -  Creatinine 0.40 - 1.20 mg/dL -  Sodium 135 - 145 mEq/L -  Potassium 3.5 - 5.1 mEq/L -  Chloride 96 - 112 mEq/L -  CO2 19 - 32 mEq/L -  Calcium 8.4 - 10.5 mg/dL -  Total Protein 6.0 - 8.5 g/dL 7.4  Total Bilirubin 0.0 - 1.2 mg/dL 0.2  Alkaline Phos 39 - 117 IU/L 75  AST 0 - 40 IU/L 9  ALT 0 - 32 IU/L 12   CBC Latest Ref Rng & Units 09/18/2019  WBC 4.0 - 10.5 K/uL 17.0(H)  Hemoglobin 12.0 - 15.0 g/dL 12.6  Hematocrit 36.0 - 46.0 % 40.8  Platelets 150 - 400 K/uL 290     No results found.  LABORATORY DATA:  I have reviewed the data as listed Lab Results  Component Value Date   WBC 17.0 (H) 09/18/2019   HGB 12.6 09/18/2019    HCT 40.8 09/18/2019   MCV 84.6 09/18/2019   PLT 290 09/18/2019   Recent Labs    11/03/18 1422 09/05/19 1403 09/12/19 1410  NA 145 140  --   K 3.0* 3.4*  --   CL 107 103  --   CO2 30 27  --   GLUCOSE 99 100*  --   BUN 8 13  --   CREATININE 0.91 0.92  --   CALCIUM 9.0 9.7  --   PROT 7.4 7.9 7.4  ALBUMIN 4.2 4.7 4.4  AST 12 12 9   ALT 14 13 12   ALKPHOS 70 68 75  BILITOT 0.4 0.4 0.2  BILIDIR  --   --  0.08   Iron/TIBC/Ferritin/ %Sat    Component Value Date/Time   IRON 47 11/03/2018 1422   TIBC 339 11/03/2018 1422   FERRITIN 108 11/03/2018 1422   IRONPCTSAT 14 (L) 11/03/2018 1422        ASSESSMENT & PLAN:  1. Lymphocytosis   2. Vitamin D deficiency   3. Onychomycosis    Labs reviewed and discussed with patient that Leukocytosis, predominantly neutrophilia, can be secondary to infection, chronic inflammation, smoking, autoimmune disease, or underlying bone marrow disorders.   For the work up of patient's leukocytosis, I recommend checking CBC;CMP,, pathology smear review, flowcytometry, etc. Vitamin D deficiency, recent vitamin D levels 19.6 recommend patient to start vitamin D supplementation. Bilateral onychomycosis patient is currently on terbinafine for treatments. Orders Placed This Encounter  Procedures  . CBC with Differential/Platelet    Standing Status:   Future    Number of Occurrences:   1    Standing Expiration Date:   09/17/2020  . Technologist smear review  Standing Status:   Future    Number of Occurrences:   1    Standing Expiration Date:   09/17/2020  . Comp panel: Leukemia/Lymphoma    Standing Status:   Future    Number of Occurrences:   1    Standing Expiration Date:   09/17/2020    All questions were answered. The patient knows to call the clinic with any problems questions or concerns.  Return of visit: 2 weeks to discuss labs. Thank you for this kind referral and the opportunity to participate in the care of this patient. A copy of  today's note is routed to referring provider   Earlie Server, MD, PhD Hematology Oncology [facility] Pager- [phone] 09/18/2019

## 2019-09-18 NOTE — Progress Notes (Signed)
Patient here for intial visit. Referred by Dr. Aundra Dubin for leukocytosis. Pt states she sweats "all the time."

## 2019-09-20 LAB — COMP PANEL: LEUKEMIA/LYMPHOMA

## 2019-10-02 ENCOUNTER — Inpatient Hospital Stay: Payer: BC Managed Care – PPO | Attending: Oncology | Admitting: Oncology

## 2019-10-02 ENCOUNTER — Encounter: Payer: Self-pay | Admitting: Oncology

## 2019-10-02 ENCOUNTER — Other Ambulatory Visit: Payer: Self-pay

## 2019-10-02 VITALS — BP 121/75 | HR 82 | Temp 96.6°F | Resp 18 | Wt 229.9 lb

## 2019-10-02 DIAGNOSIS — R21 Rash and other nonspecific skin eruption: Secondary | ICD-10-CM | POA: Diagnosis not present

## 2019-10-02 DIAGNOSIS — Z79899 Other long term (current) drug therapy: Secondary | ICD-10-CM | POA: Diagnosis not present

## 2019-10-02 DIAGNOSIS — D72829 Elevated white blood cell count, unspecified: Secondary | ICD-10-CM

## 2019-10-02 DIAGNOSIS — D72820 Lymphocytosis (symptomatic): Secondary | ICD-10-CM | POA: Diagnosis not present

## 2019-10-02 DIAGNOSIS — Z87891 Personal history of nicotine dependence: Secondary | ICD-10-CM | POA: Insufficient documentation

## 2019-10-02 DIAGNOSIS — R5382 Chronic fatigue, unspecified: Secondary | ICD-10-CM | POA: Insufficient documentation

## 2019-10-02 NOTE — Progress Notes (Signed)
Patient here for follow up. No concerns voiced today.  

## 2019-10-03 NOTE — Progress Notes (Signed)
Hematology/Oncology Consult note [facility] [phone] [phone]   Patient Care Team: McLean-Scocuzza, Nino Glow, MD as PCP - General (Internal Medicine)  REFERRING PROVIDER: McLean-Scocuzza, Nino Glow, MD  CHIEF COMPLAINTS/REASON FOR VISIT:  Evaluation of leukocytosis  HISTORY OF PRESENTING ILLNESS:  Carmen Cooley is a  54 y.o.  female with PMH listed below who was referred to me for evaluation of leukocytosis Reviewed patient' recent labs obtained by PCP.  09/05/2019 CBC showed elevated white count of 14.3, predominantly lymphocytes.  Absolute lymphocyte 5.5. Previous lab records reviewed. Leukocytosis onset of chronic, duration is since 2013.  Chronic lymphocytosis with absolute lymphocyte ranging from 3.8-6.2.  No aggravating or elevated factors. Associated symptoms or signs:  Denies weight loss, fever, chills,night sweats.  Chronic fatigue Smoking history: Former smoker.  Stopped smoking approximately 6 months ago. History of recent oral steroid use or steroid injection: Denies History of recent infection: Denies Autoimmune disease history.  Denies  Denies any IUD device.   INTERVAL HISTORY Carmen Cooley is a 54 y.o. female who has above history reviewed by me today presents for follow up visit for management of leukocytosis Problems and complaints are listed below: Patient denies any new complaints.  She has had blood work done during interval. She reports that she has developed bilateral rash under her breast.  She has an appointment to see primary care provider. She has been on terbinafine for Bilateral onychomycosis She has noticed completely stopped smoking.  She smokes a few cigarettes every once a while.  Review of Systems  Constitutional: Negative for appetite change, chills, fatigue and fever.  HENT:   Negative for hearing loss and voice change.   Eyes: Negative for eye problems.  Respiratory: Negative for chest  tightness and cough.   Cardiovascular: Negative for chest pain.  Gastrointestinal: Negative for abdominal distention, abdominal pain and blood in stool.  Endocrine: Negative for hot flashes.  Genitourinary: Negative for difficulty urinating and frequency.   Musculoskeletal: Negative for arthralgias.  Skin: Negative for itching and rash.  Neurological: Negative for extremity weakness.  Hematological: Negative for adenopathy.  Psychiatric/Behavioral: Negative for confusion.     MEDICAL HISTORY:  Past Medical History:  Diagnosis Date  . Allergy   . History of blood transfusion   . Migraines   . Seizure ([place])   . UTI (urinary tract infection)     SURGICAL HISTORY: Past Surgical History:  Procedure Laterality Date  . 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 CESAREAN SECTION     x 1  . COLONOSCOPY WITH PROPOFOL N/A 11/16/2018   Procedure: COLONOSCOPY WITH PROPOFOL;  Surgeon: Jonathon Bellows, MD;  Location: [facility] ENDOSCOPY;  Service: Gastroenterology;  Laterality: N/A;  . FLEXIBLE SIGMOIDOSCOPY N/A 05/25/2019   Procedure: FLEXIBLE SIGMOIDOSCOPY;  Surgeon: Jonathon Bellows, MD;  Location: [facility] ENDOSCOPY;  Service: Gastroenterology;  Laterality: N/A;  . FOOT SURGERY    . KNEE SURGERY      SOCIAL HISTORY: Social History   Socioeconomic History  . Marital status: Single    Spouse name: Not on file  . Number of children: Not on file  . Years of education: Not on file  . Highest education level: Not on file  Occupational History  . Not on file  Tobacco Use  . Smoking status: Former Smoker    Packs/day: 0.50    Years: 33.00    Pack years: 16.50    Quit date: 09/28/2018  Years since quitting: 1.0  . Smokeless tobacco: Never Used  Substance and Sexual Activity  . Alcohol use: Not Currently    Comment: occasional  . Drug use: No  . Sexual activity: Not Currently  Other Topics Concern  . Not on file  Social History Narrative   4 kids    Single    Some college,  truck driver    No guns    Wears seat belt    Safe in relationship   Truck driver for [employer]    Social Determinants of Health   Financial Resource Strain:   . Difficulty of Paying Living Expenses: Not on file  Food Insecurity:   . Worried About Charity fundraiser in the Last Year: Not on file  . Ran Out of Food in the Last Year: Not on file  Transportation Needs:   . Lack of Transportation (Medical): Not on file  . Lack of Transportation (Non-Medical): Not on file  Physical Activity:   . Days of Exercise per Week: Not on file  . Minutes of Exercise per Session: Not on file  Stress:   . Feeling of Stress : Not on file  Social Connections:   . Frequency of Communication with Friends and Family: Not on file  . Frequency of Social Gatherings with Friends and Family: Not on file  . Attends Religious Services: Not on file  . Active Member of Clubs or Organizations: Not on file  . Attends Archivist Meetings: Not on file  . Marital Status: Not on file  Intimate Partner Violence:   . Fear of Current or Ex-Partner: Not on file  . Emotionally Abused: Not on file  . Physically Abused: Not on file  . Sexually Abused: Not on file    FAMILY HISTORY: Family History  Problem Relation Age of Onset  . Diabetes Father   . Cancer Father        sinus   . Hypertension Father   . Cancer Other   . Cancer Mother        pancreatic/whipple/chemo  . Hypertension Mother   . Cancer Maternal Grandmother        lung smoker  . Cancer Paternal Grandmother        elbow per pt     ALLERGIES:  is allergic to compazine [prochlorperazine] and contrast media [iodinated diagnostic agents].  MEDICATIONS:  Current Outpatient Medications  Medication Sig Dispense Refill  . ammonium lactate (AMLACTIN) 12 % lotion Apply 1 application topically as needed for dry skin. 400 g 0  . Cholecalciferol (VITAMIN D3) 2400 UNIT/ML LIQD by Does not apply route daily. Patient takes 15 drops under tongue  daily.    . Cholecalciferol 1.25 MG (50000 UT) capsule Take 1 capsule (50,000 Units total) by mouth once a week. 13 capsule 3  . pantoprazole (PROTONIX) 40 MG tablet Take 1 tablet (40 mg total) by mouth daily. 30 minutes before food 90 tablet 1  . terbinafine (LAMISIL) 250 MG tablet Take 1 tablet (250 mg total) by mouth daily. 90 tablet 0   No current facility-administered medications for this visit.     PHYSICAL EXAMINATION: ECOG PERFORMANCE STATUS: 0 - Asymptomatic Vitals:   10/02/19 1445  BP: 121/75  Pulse: 82  Resp: 18  Temp: (!) 96.6 F (35.9 C)   Filed Weights   10/02/19 1445  Weight: 229 lb 14.4 oz (104.3 kg)    Physical Exam Constitutional:      General: She is not in  acute distress.    Appearance: She is obese.  HENT:     Head: Normocephalic and atraumatic.  Eyes:     General: No scleral icterus.    Pupils: Pupils are equal, round, and reactive to light.  Cardiovascular:     Rate and Rhythm: Normal rate and regular rhythm.     Heart sounds: Normal heart sounds.  Pulmonary:     Effort: Pulmonary effort is normal. No respiratory distress.     Breath sounds: No wheezing.  Abdominal:     General: Bowel sounds are normal. There is no distension.     Palpations: Abdomen is soft. There is no mass.     Tenderness: There is no abdominal tenderness.  Musculoskeletal:        General: No deformity. Normal range of motion.     Cervical back: Normal range of motion and neck supple.  Skin:    General: Skin is warm and dry.     Findings: No erythema or rash.  Neurological:     Mental Status: She is alert and oriented to person, place, and time.     Cranial Nerves: No cranial nerve deficit.     Coordination: Coordination normal.  Psychiatric:        Behavior: Behavior normal.        Thought Content: Thought content normal.     CMP Latest Ref Rng & Units 09/12/2019  Glucose 70 - 99 mg/dL -  BUN 6 - 23 mg/dL -  Creatinine 0.40 - 1.20 mg/dL -  Sodium 135 - 145  mEq/L -  Potassium 3.5 - 5.1 mEq/L -  Chloride 96 - 112 mEq/L -  CO2 19 - 32 mEq/L -  Calcium 8.4 - 10.5 mg/dL -  Total Protein 6.0 - 8.5 g/dL 7.4  Total Bilirubin 0.0 - 1.2 mg/dL 0.2  Alkaline Phos 39 - 117 IU/L 75  AST 0 - 40 IU/L 9  ALT 0 - 32 IU/L 12   CBC Latest Ref Rng & Units 09/18/2019  WBC 4.0 - 10.5 K/uL 17.0(H)  Hemoglobin 12.0 - 15.0 g/dL 12.6  Hematocrit 36.0 - 46.0 % 40.8  Platelets 150 - 400 K/uL 290     No results found.  LABORATORY DATA:  I have reviewed the data as listed Lab Results  Component Value Date   WBC 17.0 (H) 09/18/2019   HGB 12.6 09/18/2019   HCT 40.8 09/18/2019   MCV 84.6 09/18/2019   PLT 290 09/18/2019   Recent Labs    11/03/18 1422 09/05/19 1403 09/12/19 1410  NA 145 140  --   K 3.0* 3.4*  --   CL 107 103  --   CO2 30 27  --   GLUCOSE 99 100*  --   BUN 8 13  --   CREATININE 0.91 0.92  --   CALCIUM 9.0 9.7  --   PROT 7.4 7.9 7.4  ALBUMIN 4.2 4.7 4.4  AST 12 12 9   ALT 14 13 12   ALKPHOS 70 68 75  BILITOT 0.4 0.4 0.2  BILIDIR  --   --  0.08   Iron/TIBC/Ferritin/ %Sat    Component Value Date/Time   IRON 47 11/03/2018 1422   TIBC 339 11/03/2018 1422   FERRITIN 108 11/03/2018 1422   IRONPCTSAT 14 (L) 11/03/2018 1422        ASSESSMENT & PLAN:  1. Leukocytosis, unspecified type    #Leukocytosis, white count 17, predominantly neutrophilia and lymphocytosis Leukocytosis is chronic. Work-up labs results were  reviewed and discussed with patient. Blood smear showed unremarkable WBC and RBC morphology.  Adequate platelet Peripheral blood flow cytometry showed no significant diagnostic immunophenotypic abnormality. She patient does have increased multiple lymphocyte subsets which may represent a reactive process.  Discussed with the patient that leukocytosis is most likely is reactive.  Smoking can contribute to leukocytosis and cutaneous fungus infection can also cause that. She does not have significant constitutional  symptoms.   Recommend observation.  Repeat blood work in 6 months.  Orders Placed This Encounter  Procedures  . CBC with Differential    Standing Status:   Future    Standing Expiration Date:   10/01/2020    All questions were answered. The patient knows to call the clinic with any problems questions or concerns.  Return of visit: 6 months   Earlie Server, MD, PhD Hematology Oncology [facility] Pager- [phone] 10/03/2019

## 2019-10-04 ENCOUNTER — Ambulatory Visit
Admission: RE | Admit: 2019-10-04 | Discharge: 2019-10-04 | Disposition: A | Discharge status: Home / Self Care | Payer: BC Managed Care – PPO | Source: Ambulatory Visit | Attending: Internal Medicine | Admitting: Internal Medicine

## 2019-10-04 ENCOUNTER — Other Ambulatory Visit: Payer: Self-pay

## 2019-10-04 DIAGNOSIS — E041 Nontoxic single thyroid nodule: Secondary | ICD-10-CM | POA: Diagnosis present

## 2019-10-12 ENCOUNTER — Other Ambulatory Visit: Payer: Self-pay

## 2019-10-12 ENCOUNTER — Ambulatory Visit (INDEPENDENT_AMBULATORY_CARE_PROVIDER_SITE_OTHER): Payer: BC Managed Care – PPO | Admitting: Internal Medicine

## 2019-10-12 ENCOUNTER — Encounter: Payer: Self-pay | Admitting: Internal Medicine

## 2019-10-12 VITALS — Ht 66.0 in | Wt 229.0 lb

## 2019-10-12 DIAGNOSIS — L304 Erythema intertrigo: Secondary | ICD-10-CM

## 2019-10-12 DIAGNOSIS — B353 Tinea pedis: Secondary | ICD-10-CM

## 2019-10-12 DIAGNOSIS — E049 Nontoxic goiter, unspecified: Secondary | ICD-10-CM | POA: Diagnosis not present

## 2019-10-12 DIAGNOSIS — E041 Nontoxic single thyroid nodule: Secondary | ICD-10-CM

## 2019-10-12 DIAGNOSIS — D72820 Lymphocytosis (symptomatic): Secondary | ICD-10-CM

## 2019-10-12 DIAGNOSIS — E559 Vitamin D deficiency, unspecified: Secondary | ICD-10-CM

## 2019-10-12 DIAGNOSIS — B351 Tinea unguium: Secondary | ICD-10-CM | POA: Insufficient documentation

## 2019-10-12 MED ORDER — HYDROCORTISONE 2.5 % EX CREA: TOPICAL_CREAM | Freq: Two times a day (BID) | CUTANEOUS | 2 refills | Status: DC

## 2019-10-12 MED ORDER — FLUCONAZOLE 150 MG PO TABS: 150.0000 mg | ORAL_TABLET | ORAL | 0 refills | Status: DC

## 2019-10-12 MED ORDER — MICONAZOLE NITRATE 2 % EX CREA: 1.0000 "application " | TOPICAL_CREAM | Freq: Two times a day (BID) | CUTANEOUS | 11 refills | Status: DC

## 2019-10-12 NOTE — Progress Notes (Signed)
Virtual Visit via Video Note  I connected with Carmen Cooley   on 10/12/19 at  8:40 AM EST by a video enabled telemedicine application and verified that I am speaking with the correct person using two identifiers.  Location patient: car Location provider:work or home office Persons participating in the virtual visit: patient, provider  I discussed the limitations of evaluation and management by telemedicine and the availability of in person appointments. The patient expressed understanding and agreed to proceed.   HPI: 1. Right breast intertrigo with pain, burning, redness rash she reports sweating underneath breast 2. Leukocytosis per h/o likely reactive f/u in 6 months 3. Thyroid nodule stable f/u in 09/2020 [place]  4. Tinea pedis and onychmycosis f/u podiatry on lamisil 250 mg qd x 3 months  5. Vita D def on D3 50K IU weekly  6. Dad and uncle + covid she has not been around today daughter and mom being tested covid 19 ROS: See pertinent positives and negatives per HPI.  Past Medical History:  Diagnosis Date  . Allergy   . History of blood transfusion   . Migraines   . Seizure ([place])   . UTI (urinary tract infection)     Past Surgical History:  Procedure Laterality Date  . 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 CESAREAN SECTION     x 1  . COLONOSCOPY WITH PROPOFOL N/A 11/16/2018   Procedure: COLONOSCOPY WITH PROPOFOL;  Surgeon: Jonathon Bellows, MD;  Location: [facility] ENDOSCOPY;  Service: Gastroenterology;  Laterality: N/A;  . FLEXIBLE SIGMOIDOSCOPY N/A 05/25/2019   Procedure: FLEXIBLE SIGMOIDOSCOPY;  Surgeon: Jonathon Bellows, MD;  Location: [facility] ENDOSCOPY;  Service: Gastroenterology;  Laterality: N/A;  . FOOT SURGERY    . KNEE SURGERY      Family History  Problem Relation Age of Onset  . Diabetes Father   . Cancer Father        sinus   . Hypertension Father   . Cancer Other   . Cancer Mother        pancreatic/whipple/chemo  . Hypertension Mother   . Cancer  Maternal Grandmother        lung smoker  . 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  ammonium lactate (AMLACTIN) 12 % lotion, Apply 1 application topically as needed for dry skin., Disp: 400 g, Rfl: 0 .  Cholecalciferol (VITAMIN D3) 2400 UNIT/ML LIQD, by Does not apply route daily. Patient takes 15 drops under tongue daily., Disp: , Rfl:  .  Cholecalciferol 1.25 MG (50000 UT) capsule, Take 1 capsule (50,000 Units total) by mouth once a week., Disp: 13 capsule, Rfl: 3 .  fluconazole (DIFLUCAN) 150 MG tablet, Take 1 tablet (150 mg total) by mouth once a week. X 2 weeks, Disp: 2 tablet, Rfl: 0 .  hydrocortisone 2.5 % cream, Apply topically 2 (two) times daily. Prn right breast, Disp: 60 g, Rfl: 2 .  miconazole (MICATIN) 2 % cream, Apply 1 application topically 2 (two) times daily., Disp: 60 g, Rfl: 11 .  pantoprazole (PROTONIX) 40 MG tablet, Take 1 tablet (40 mg total) by mouth daily. 30 minutes before food, Disp: 90 tablet, Rfl: 1 .  terbinafine (LAMISIL)  250 MG tablet, Take 1 tablet (250 mg total) by mouth daily., Disp: 90 tablet, Rfl: 0  EXAM:  VITALS per patient if applicable:  GENERAL: alert, oriented, appears well and in no acute distress  HEENT: atraumatic, conjunttiva clear, no obvious abnormalities on inspection of external nose and ears  NECK: normal movements of the head and neck  LUNGS: on inspection no signs of respiratory distress, breathing rate appears normal, no obvious gross SOB, gasping or wheezing  CV: no obvious cyanosis  MS: moves all visible extremities without noticeable abnormality  PSYCH/NEURO: pleasant and cooperative, no obvious depression or anxiety, speech and thought processing grossly intact  Skin intertrigo right breast severe  ASSESSMENT AND  PLAN:  Discussed the following assessment and plan:  Intertrigo - Plan: miconazole (MICATIN) 2 % cream, fluconazole (DIFLUCAN) 150 MG tablet 1x per week x 2 weeks, hydrocortisone 2.5 % cream  Goiter/nodule f/u in 09/2020 [place] and ENT Dr. Benjamine Mola  Vitamin D deficiency Cont high dose D3  Tinea pedis of both feet Onychomycosis F/u podiatry on lamisil 250 mg qd x 3 months  Will need repeat lfts   Leukocytosis,likely reactive per Hematology Dr Tasia Catchings f/u In 6 months   Declines flu shot  Tdap per pt had in 2014  declines pna 23, shingrix/covid 19 vaccine  Mammogram 08/03/19 normal  Colonoscopy2-19-20 hyperplastic polyps f/u 10 years -flex sig 05/25/19 and colonoscopy 11/16/18   Pap get record [facility] in [place] signed release todayreceived no record -pap need to schedule for future   Smoker rec smoking cessation on and off since age 62 max 1 ppd now 1ppd FH lung cancer -as of 09/07/19 denies smoking cigs   US thyroid 11/23/18 left lower nodules f/u 1 yearpt needs to call ENT for f/u and consider Dr. Harlow Asa in future to remove thyroid vs ENT  -10/04/19 thyroid US stable thyroid nodule f/u in 1 year   Eye Dr. Gloriann Loan   -we discussed possible serious and likely etiologies, options for evaluation and workup, limitations of telemedicine visit vs in person visit, treatment, treatment risks and precautions. Pt prefers to treat via telemedicine empirically rather then risking or undertaking an in person visit at this moment. Patient agrees to seek prompt in person care if worsening, new symptoms arise, or if is not improving with treatment.   I discussed the assessment and treatment plan with the patient. The patient was provided an opportunity to ask questions and all were answered. The patient agreed with the plan and demonstrated an understanding of the instructions.   The patient was advised to call back or seek an in-person evaluation if the symptoms worsen or if the condition  fails to improve as anticipated.  Time spent 20 minutes  Delorise Jackson, MD

## 2019-10-18 ENCOUNTER — Ambulatory Visit: Payer: BC Managed Care – PPO | Attending: Internal Medicine

## 2019-10-18 DIAGNOSIS — Z20822 Contact with and (suspected) exposure to covid-19: Secondary | ICD-10-CM

## 2019-10-19 LAB — NOVEL CORONAVIRUS, NAA: SARS-CoV-2, NAA: NOT DETECTED

## 2019-12-05 ENCOUNTER — Other Ambulatory Visit: Payer: Self-pay

## 2019-12-07 ENCOUNTER — Encounter: Payer: Self-pay | Admitting: Internal Medicine

## 2019-12-07 ENCOUNTER — Ambulatory Visit (INDEPENDENT_AMBULATORY_CARE_PROVIDER_SITE_OTHER): Payer: BC Managed Care – PPO | Admitting: Internal Medicine

## 2019-12-07 ENCOUNTER — Other Ambulatory Visit (HOSPITAL_COMMUNITY)
Admission: RE | Admit: 2019-12-07 | Discharge: 2019-12-07 | Disposition: A | Discharge status: Home / Self Care | Payer: BC Managed Care – PPO | Source: Ambulatory Visit | Attending: Internal Medicine | Admitting: Internal Medicine

## 2019-12-07 ENCOUNTER — Other Ambulatory Visit: Payer: Self-pay

## 2019-12-07 VITALS — BP 126/82 | HR 83 | Temp 97.3°F | Ht 66.0 in | Wt 227.2 lb

## 2019-12-07 DIAGNOSIS — W19XXXA Unspecified fall, initial encounter: Secondary | ICD-10-CM

## 2019-12-07 DIAGNOSIS — Z124 Encounter for screening for malignant neoplasm of cervix: Secondary | ICD-10-CM | POA: Diagnosis present

## 2019-12-07 DIAGNOSIS — M25462 Effusion, left knee: Secondary | ICD-10-CM

## 2019-12-07 DIAGNOSIS — H6122 Impacted cerumen, left ear: Secondary | ICD-10-CM

## 2019-12-07 DIAGNOSIS — N841 Polyp of cervix uteri: Secondary | ICD-10-CM | POA: Diagnosis not present

## 2019-12-07 DIAGNOSIS — M25562 Pain in left knee: Secondary | ICD-10-CM

## 2019-12-07 DIAGNOSIS — N760 Acute vaginitis: Secondary | ICD-10-CM | POA: Diagnosis present

## 2019-12-07 DIAGNOSIS — L304 Erythema intertrigo: Secondary | ICD-10-CM

## 2019-12-07 NOTE — Progress Notes (Signed)
Chief Complaint  Patient presents with  . Follow-up  . Gynecologic Exam    Patient states she is have some itching and burning.    F/u  1. Left ear wax c/o left ear itching  2. Pap due today  3. Fall 12/06/19 w/o left knee pain some swelling she had fall down stairs     Review of Systems  Constitutional: Positive for weight loss.  HENT: Positive for hearing loss.   Respiratory: Negative for shortness of breath.   Cardiovascular: Negative for chest pain.  Genitourinary:       Denies vaginal bleeding    Musculoskeletal: Positive for falls. Negative for joint pain.  Neurological: Negative for headaches.  Psychiatric/Behavioral: Negative for depression.   Past Medical History:  Diagnosis Date  . Allergy   . History of blood transfusion   . Migraines   . Seizure ([place])   . UTI (urinary tract infection)    Past Surgical History:  Procedure Laterality Date  . 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 CESAREAN SECTION     x 1  . COLONOSCOPY WITH PROPOFOL N/A 11/16/2018   Procedure: COLONOSCOPY WITH PROPOFOL;  Surgeon: Jonathon Bellows, MD;  Location: [facility] ENDOSCOPY;  Service: Gastroenterology;  Laterality: N/A;  . FLEXIBLE SIGMOIDOSCOPY N/A 05/25/2019   Procedure: FLEXIBLE SIGMOIDOSCOPY;  Surgeon: Jonathon Bellows, MD;  Location: [facility] ENDOSCOPY;  Service: Gastroenterology;  Laterality: N/A;  . FOOT SURGERY    . KNEE SURGERY     Family History  Problem Relation Age of Onset  . Diabetes Father   . Cancer Father        sinus   . Hypertension Father   . Cancer Other   . Cancer Mother        pancreatic/whipple/chemo  . Hypertension Mother   . Cancer Maternal Grandmother        lung smoker  . Cancer Paternal Grandmother        elbow per pt    Social History   Socioeconomic History  . Marital status: Single    Spouse name: Not on file  . Number of children: Not on file  . Years of education: Not on file  . Highest education level: Not on file  Occupational  History  . Not on file  Tobacco Use  . Smoking status: Former Smoker    Packs/day: 0.50    Years: 33.00    Pack years: 16.50    Quit date: 09/28/2018    Years since quitting: 1.1  . Smokeless tobacco: Never Used  Substance and Sexual Activity  . Alcohol use: Not Currently    Comment: occasional  . Drug use: No  . Sexual activity: Not Currently  Other Topics Concern  . 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 Difficulty of Paying Living Expenses:   Food Insecurity:   . Worried About Charity fundraiser in the Last Year:   . Arboriculturist in the Last Year:   Transportation Needs:   . Film/video editor (Medical):   Marland Kitchen Lack of Transportation (Non-Medical):   Physical Activity:   .  Days of Exercise per Week:   . Minutes of Exercise per Session:   Stress:   . Feeling of Stress :   Social Connections:   . Frequency of Communication with Friends and Family:   . Frequency of Social Gatherings with Friends and Family:   . Attends Religious Services:   . Active Member of Clubs or Organizations:   . Attends Archivist Meetings:   Marland Kitchen Marital Status:   Intimate Partner Violence:   . Fear of Current or Ex-Partner:   . Emotionally Abused:   Marland Kitchen Physically Abused:   . Sexually Abused:    Current Meds  Medication Sig  . ammonium lactate (AMLACTIN) 12 % lotion Apply 1 application topically as needed for dry skin.  . Cholecalciferol (VITAMIN D3) 2400 UNIT/ML LIQD by Does not apply route daily. Patient takes 15 drops under tongue daily.  . fluconazole (DIFLUCAN) 150 MG tablet Take 1 tablet (150 mg total) by mouth once a week. X 2 weeks  . hydrocortisone 2.5 % cream Apply topically 2 (two) times daily. Prn right breast  . pantoprazole (PROTONIX) 40 MG  tablet Take 1 tablet (40 mg total) by mouth daily. 30 minutes before food  . terbinafine (LAMISIL) 250 MG tablet Take 1 tablet (250 mg total) by mouth daily.   Allergies  Allergen Reactions  . Compazine [Prochlorperazine] Shortness Of Breath  . Contrast Media [Iodinated Diagnostic Agents] Hives   Recent Results (from the past 2160 hour(s))  Hepatic Function Panel     Status: None   Collection Time: 09/12/19  2:10 PM  Result Value Ref Range   Total Protein 7.4 6.0 - 8.5 g/dL   Albumin 4.4 3.8 - 4.9 g/dL   Bilirubin Total 0.2 0.0 - 1.2 mg/dL   Bilirubin, Direct 0.08 0.00 - 0.40 mg/dL   Alkaline Phosphatase 75 39 - 117 IU/L   AST 9 0 - 40 IU/L   ALT 12 0 - 32 IU/L  Comp panel: Leukemia/Lymphoma     Status: None   Collection Time: 09/18/19  3:42 PM  Result Value Ref Range   PATH INTERP XXX-IMP Comment     Comment: (NOTE) No significant diagnostic immunophenotypic abnormality detected. Lymphocytosis. (See comment.)    ANNOTATION COMMENT IMP Comment     Comment: (NOTE) Lymphocytosis is due to increases in total T cells, T helper cells, T suppressor cells, and B cells. Increases in multiple lymphocyte subsets may suggest a reactive process. Clinical correlation is recommended.    CLINICAL INFO Comment     Comment: (NOTE) Accompanying CBC dated 09-18-19 shows: WBC count 17.0, Neu 8.4, Lym 7.0, Mon 1.0.    Specimen Type Comment     Comment: Peripheral blood   ASSESSMENT OF LEUKOCYTES Comment     Comment: (NOTE) No monoclonal B cell population is detected. kappa:lambda ratio 2.1 There is no loss of, or aberrant expression of, the pan T cell antigens to suggest a neoplastic T cell process. CD4:CD8 ratio 3.3 No circulating blasts are detected. There is no immunophenotypic evidence of abnormal myeloid maturation. Analysis of the leukocyte population shows: granulocytes 57%, monocytes 5%, lymphocytes 38%, blasts <0.1%, B cells 4%, T cells 33%, NK cells 1%.    % Viable Cells  Comment     Comment: 95%   ANALYSIS AND GATING STRATEGY Comment     Comment: 8 color analysis with CD45/SSC   IMMUNOPHENOTYPING STUDY Comment     Comment: (NOTE) CD2       Normal  CD3       Normal CD4       Normal         CD5       Normal CD7       Normal         CD8       Normal CD10      Normal         CD11b     Normal CD13      Normal         CD14      Normal CD16      Normal         CD19      Normal CD20      Normal         CD33      Normal CD34      Normal         CD38      Normal CD45      Normal         CD56      Normal CD57      Normal         CD117     Normal HLA-DR    Normal         KAPPA     Normal LAMBDA    Normal         CD64      Normal    PATHOLOGIST NAME Comment     Comment: Cecilie Kicks, M. D.   COMMENT: Comment     Comment: (NOTE) Each antibody in this assay was utilized to assess for potential abnormalities of studied cell populations or to characterize identified abnormalities. This test was developed and its performance characteristics determined by [facility].  It has not been cleared or approved by the U.S. Food and Drug Administration. The FDA has determined that such clearance or approval is not necessary. This test is used for clinical purposes.  It should not be regarded as investigational or for research. Performed At: -[facility] [address] [place], [place] [id number] Katina Degree MDPhD KG:[id number] Performed At: [facility] [address] [place], [place] [id number] Katina Degree MDPhD FO:[id number]   CBC with Differential/Platelet     Status: Abnormal   Collection Time: 09/18/19  3:42 PM  Result Value Ref Range   WBC 17.0 (H) 4.0 - 10.5 K/uL   RBC 4.82 3.87 - 5.11 MIL/uL   Hemoglobin 12.6 12.0 - 15.0 g/dL   HCT 40.8 36.0 - 46.0 %   MCV 84.6 80.0 - 100.0 fL   MCH 26.1 26.0 - 34.0 pg   MCHC 30.9 30.0 - 36.0 g/dL   RDW 16.2 (H) 11.5 - 15.5 %   Platelets 290 150 - 400 K/uL   nRBC 0.0 0.0 - 0.2 %   Neutrophils Relative  % 50 %   Neutro Abs 8.4 (H) 1.7 - 7.7 K/uL   Lymphocytes Relative 41 %   Lymphs Abs 7.0 (H) 0.7 - 4.0 K/uL   Monocytes Relative 6 %   Monocytes Absolute 1.0 0.1 - 1.0 K/uL   Eosinophils Relative 3 %   Eosinophils Absolute 0.4 0.0 - 0.5 K/uL   Basophils Relative 0 %   Basophils Absolute 0.1 0.0 - 0.1 K/uL   Immature Granulocytes 0 %   Abs Immature Granulocytes 0.04 0.00 - 0.07 K/uL    Comment: Performed at [facility], [address] [place], [place]  [address]  Technologist smear review     Status: None   Collection Time: 09/18/19  3:43 PM  Result Value Ref Range   WBC Morphology UNREMARKABLE    RBC Morphology UNREMARKABLE    Tech Review PLATELETS APPEAR ADEQUATE     Comment: Normal platelet morphology Performed at [facility], [address] [place], [place] [address]   Novel Coronavirus, NAA ([facility])     Status: None   Collection Time: 10/18/19 10:39 AM   Specimen: Nasopharyngeal(NP) swabs in vial transport medium   NASOPHARYNGE  TESTING  Result Value Ref Range   SARS-CoV-2, NAA Not Detected Not Detected    Comment: This nucleic acid amplification test was developed and its performance characteristics determined by [employer] and [employer]. Nucleic acid amplification tests include RT-PCR and TMA. This test has not been FDA cleared or approved. This test has been authorized by FDA under an Emergency Use Authorization (EUA). This test is only authorized for the duration of time the declaration that circumstances exist justifying the authorization of the emergency use of in vitro diagnostic tests for detection of SARS-CoV-2 virus and/or diagnosis of COVID-19 infection under section 564(b)(1) of the Act, 21 U.S.C. 295AOZ-3(Y) (1), unless the authorization is terminated or revoked sooner. When diagnostic testing is negative, the possibility of a false negative result should be considered in the context of a patient's recent exposures and the presence of clinical  signs and symptoms consistent with COVID-19. An individual without symptoms of COVID-19 and who is not shedding SARS-CoV-2 virus wo uld expect to have a negative (not detected) result in this assay.    Objective  Body mass index is 36.67 kg/m. Wt Readings from Last 3 Encounters:  12/07/19 227 lb 3.2 oz (103.1 kg)  10/12/19 229 lb (103.9 kg)  10/02/19 229 lb 14.4 oz (104.3 kg)   Temp Readings from Last 3 Encounters:  12/07/19 (!) 97.3 F (36.3 C) (Temporal)  10/02/19 (!) 96.6 F (35.9 C)  09/18/19 98.4 F (36.9 C) (Tympanic)   BP Readings from Last 3 Encounters:  12/07/19 126/82  10/02/19 121/75  09/18/19 123/77   Pulse Readings from Last 3 Encounters:  12/07/19 83  10/02/19 82  09/18/19 90    Physical Exam Vitals and nursing note reviewed. Exam conducted with a chaperone present.  Constitutional:      Appearance: Normal appearance. She is well-developed and well-groomed. She is obese.  HENT:     Head: Normocephalic and atraumatic.     Left Ear: There is impacted cerumen.     Ears:     Comments: Cerumen impaction left ear  Eyes:     Conjunctiva/sclera: Conjunctivae normal.     Pupils: Pupils are equal, round, and reactive to light.  Cardiovascular:     Rate and Rhythm: Normal rate and regular rhythm.     Heart sounds: Normal heart sounds. No murmur.  Pulmonary:     Effort: Pulmonary effort is normal.     Breath sounds: Normal breath sounds.  Abdominal:     General: Abdomen is flat. Bowel sounds are normal.     Tenderness: There is no abdominal tenderness.    Genitourinary:    Pubic Area: Rash present.     Labia:        Right: No rash.        Left: No rash.      Vagina: Normal.     Cervix: Discharge present.     Uterus: Normal.      Adnexa: Right adnexa  normal and left adnexa normal.     Comments: +cervical polyp 1 cm   Musculoskeletal:       Legs:  Skin:    General: Skin is warm and dry.     Comments: t pedis with severe cracking of heels not  painful   Neurological:     General: No focal deficit present.     Mental Status: She is alert and oriented to person, place, and time. Mental status is at baseline.     Gait: Gait normal.  Psychiatric:        Attention and Perception: Attention and perception normal.        Mood and Affect: Mood and affect normal.        Speech: Speech normal.        Behavior: Behavior normal. Behavior is cooperative.        Thought Content: Thought content normal.        Cognition and Memory: Cognition and memory normal.        Judgment: Judgment normal.     Assessment  Plan  Impacted cerumen of left ear Used currette to get some out then lavage to get the rest tolerated and all removed  Debrox otc montly 4-7 days   Routine cervical smear - Plan: Cytology - PAP( [place]) Acute vaginitis - Plan: Cervicovaginal ancillary only( [place]) Cervical polyp - Plan: Ambulatory referral to Obstetrics / Gynecology Dr. Marcelline Mates pt will not have removed unless she is put under anesthesia   Fall, initial encounter Pain and swelling of left knee -monitor for now RICE   Intertrigo  Miconazole, gold bond powder otc   HM Declines flu shot  Tdap per pt had in 2014 declinespna 23, shingrix/covid 19 vaccine  Mammogram11/5/20 normal Colonoscopy2-19-20 hyperplastic polyps f/u 10 years -flex sig 05/25/19 and colonoscopy 11/16/18  Pap today with cervical polyp noted see above wet prep done  Smoker rec smoking cessation on and off since age 68 max 1 ppd now 1ppd FH lung cancer -as of 09/07/19 denies smoking cigs  US thyroid 11/23/18 left lower nodules f/u 1 yearpt needs to call ENT for f/u and consider Dr. Harlow Asa in future to remove thyroid vs ENT  -10/04/19 thyroid USstable thyroid nodule f/u in 1 year  as of 12/07/19 still needs to f/u and sch appt ENT   Eye Dr. Gloriann Loan  Provider: Dr. Olivia Mackie McLean-Scocuzza-Internal Medicine

## 2019-12-07 NOTE — Patient Instructions (Signed)
Debrox ear wax drops 4-7 days each ear 1x per month

## 2019-12-08 ENCOUNTER — Other Ambulatory Visit: Payer: Self-pay | Admitting: Internal Medicine

## 2019-12-08 DIAGNOSIS — B9689 Other specified bacterial agents as the cause of diseases classified elsewhere: Secondary | ICD-10-CM

## 2019-12-08 LAB — CERVICOVAGINAL ANCILLARY ONLY
Bacterial Vaginitis (gardnerella): POSITIVE — AB
Candida Glabrata: NEGATIVE
Candida Vaginitis: NEGATIVE
Comment: NEGATIVE
Comment: NEGATIVE
Comment: NEGATIVE
Comment: NEGATIVE
Trichomonas: NEGATIVE

## 2019-12-08 LAB — CYTOLOGY - PAP
Comment: NEGATIVE
Diagnosis: NEGATIVE
High risk HPV: NEGATIVE

## 2019-12-08 MED ORDER — METRONIDAZOLE 500 MG PO TABS: 500.0000 mg | ORAL_TABLET | Freq: Two times a day (BID) | ORAL | 0 refills | Status: DC

## 2019-12-26 ENCOUNTER — Telehealth: Payer: Self-pay | Admitting: Internal Medicine

## 2019-12-26 NOTE — Telephone Encounter (Signed)
Reason for Triage Call /in person: Migraine  Symptoms:Moderate Right sided migraine pain. Sensitive to light, NO fever, chills, jaw pain, dizziness, blurry vision, nasal congestion, COVID-19 vaccine, and COVID-19 exposure. Nothing seems to make it better.  Pain Scale: headaches/migraine, Right side of head, and level of pain 9/10.  Vitals: BP-118/82 , Pulse-72,  02 Sats- 100, Respirations-16 Temp 97.9,   Onset: Yesterday  Duration: Constant/throbbing  Medications:medication not used  Last seen for this problem:over a year  Outcome: Appointment has been scheduled for this Thursday at 1130 with PCP. Patient refused to go to [facility] or UC for [place].  Attach Access Nurse Triage Note.

## 2019-12-26 NOTE — Telephone Encounter (Signed)
Will see at appt   Carmen Cooley

## 2019-12-26 NOTE — Telephone Encounter (Signed)
Pt came in and asked to speak to Dr Kelly Services and states that it is very important.  migraine

## 2019-12-28 ENCOUNTER — Encounter: Payer: Self-pay | Admitting: Internal Medicine

## 2019-12-28 ENCOUNTER — Other Ambulatory Visit: Payer: Self-pay

## 2019-12-28 ENCOUNTER — Ambulatory Visit (INDEPENDENT_AMBULATORY_CARE_PROVIDER_SITE_OTHER): Payer: BC Managed Care – PPO | Admitting: Internal Medicine

## 2019-12-28 VITALS — BP 130/82 | HR 81 | Temp 97.0°F | Ht 66.0 in | Wt 232.0 lb

## 2019-12-28 DIAGNOSIS — F419 Anxiety disorder, unspecified: Secondary | ICD-10-CM

## 2019-12-28 DIAGNOSIS — G4700 Insomnia, unspecified: Secondary | ICD-10-CM | POA: Diagnosis not present

## 2019-12-28 DIAGNOSIS — F4321 Adjustment disorder with depressed mood: Secondary | ICD-10-CM | POA: Diagnosis not present

## 2019-12-28 DIAGNOSIS — R110 Nausea: Secondary | ICD-10-CM

## 2019-12-28 DIAGNOSIS — G43911 Migraine, unspecified, intractable, with status migrainosus: Secondary | ICD-10-CM

## 2019-12-28 MED ORDER — BUTORPHANOL TARTRATE 10 MG/ML NA SOLN: 1.0000 | Freq: Once | NASAL | 0 refills | Status: DC | PRN

## 2019-12-28 MED ORDER — ALPRAZOLAM 0.25 MG PO TABS: 0.2500 mg | ORAL_TABLET | Freq: Every evening | ORAL | 0 refills | Status: DC | PRN

## 2019-12-28 MED ORDER — PROMETHAZINE HCL 25 MG PO TABS: 12.5000 mg | ORAL_TABLET | Freq: Two times a day (BID) | ORAL | 2 refills | Status: DC | PRN

## 2019-12-28 NOTE — Progress Notes (Signed)
Chief Complaint  Patient presents with  . Migraine    Ongoing right now, pt states this migraine has lasted for a few days. Has a history of migraines but has not had one in a long time. States this one was caused by stress   F/u  1. Right sided h/a 8/10 with nausea, blurry vision and h/o migraines remotely.   Her uncle died Jan 14, 2020 and has had reduced sleep and crying and stress has tried imitrex, other nasal sprays formulations of imitrex, shots and pills, toradol, prednisone but only thing helps is stadol nasal, phenerghan in the past  She has not had a migraine in a while but the stress of death of relative my former pt her uncle edward dark dying 01/14/20 had trigger h/a for her  2. Grief reaction with increased stress/anxieyt   Review of Systems  Eyes: Positive for photophobia.  Respiratory: Negative for shortness of breath.   Cardiovascular: Negative for chest pain.  Gastrointestinal: Positive for nausea.  Neurological: Positive for headaches.  Psychiatric/Behavioral: The patient is nervous/anxious and has insomnia.        +grief    Past Medical History:  Diagnosis Date  . Allergy   . History of blood transfusion   . Migraines   . Seizure ([place])   . UTI (urinary tract infection)    Past Surgical History:  Procedure Laterality Date  . 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 CESAREAN SECTION     x 1  . COLONOSCOPY WITH PROPOFOL N/A 11/16/2018   Procedure: COLONOSCOPY WITH PROPOFOL;  Surgeon: Jonathon Bellows, MD;  Location: [facility] ENDOSCOPY;  Service: Gastroenterology;  Laterality: N/A;  . FLEXIBLE SIGMOIDOSCOPY N/A 05/25/2019   Procedure: FLEXIBLE SIGMOIDOSCOPY;  Surgeon: Jonathon Bellows, MD;  Location: [facility] ENDOSCOPY;  Service: Gastroenterology;  Laterality: N/A;  . FOOT SURGERY    . KNEE SURGERY     Family History  Problem Relation Age of Onset  . Diabetes Father   . Cancer Father        sinus   . Hypertension Father   . Cancer Other   . Cancer Mother         pancreatic/whipple/chemo  . Hypertension Mother   . Cancer Maternal Grandmother        lung smoker  . Cancer Paternal Grandmother        elbow per pt    Social History   Socioeconomic History  . Marital status: Single    Spouse name: Not on file  . Number of children: Not on file  . Years of education: Not on file  . Highest education level: Not on file  Occupational History  . Not on file  Tobacco Use  . Smoking status: Former Smoker    Packs/day: 0.50    Years: 33.00    Pack years: 16.50    Quit date: 09/28/2018    Years since quitting: 1.2  . Smokeless tobacco: Never Used  Substance and Sexual Activity  . Alcohol use: Not Currently    Comment: occasional  . Drug use: No  . Sexual activity: Not Currently  Other Topics Concern  . 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 Difficulty of Paying Living Expenses:   Food Insecurity:   . Worried About Charity fundraiser in the Last Year:   . Arboriculturist in the Last Year:   Transportation Needs:   . Film/video editor (Medical):   Marland Kitchen Lack of Transportation (Non-Medical):   Physical Activity:   . Days of Exercise per Week:   . Minutes of Exercise per Session:   Stress:   . Feeling of Stress :   Social Connections:   . Frequency of Communication with Friends and Family:   . Frequency of Social Gatherings with Friends and Family:   . Attends Religious Services:   . Active Member of Clubs or Organizations:   . Attends Archivist Meetings:   Marland Kitchen Marital Status:   Intimate Partner Violence:   . Fear of Current or Ex-Partner:   . Emotionally Abused:   Marland Kitchen Physically Abused:   . Sexually Abused:    Current Meds  Medication Sig  . ammonium lactate (AMLACTIN) 12 % lotion Apply 1  application topically as needed for dry skin.  . Cholecalciferol (VITAMIN D3) 2400 UNIT/ML LIQD by Does not apply route daily. Patient takes 15 drops under tongue daily.  . fluconazole (DIFLUCAN) 150 MG tablet Take 1 tablet (150 mg total) by mouth once a week. X 2 weeks  . hydrocortisone 2.5 % cream Apply topically 2 (two) times daily. Prn right breast  . metroNIDAZOLE (FLAGYL) 500 MG tablet Take 1 tablet (500 mg total) by mouth 2 (two) times daily. With food  . miconazole (MICATIN) 2 % cream Apply 1 application topically 2 (two) times daily.  . pantoprazole (PROTONIX) 40 MG tablet Take 1 tablet (40 mg total) by mouth daily. 30 minutes before food  . terbinafine (LAMISIL) 250 MG tablet Take 1 tablet (250 mg total) by mouth daily.   Allergies  Allergen Reactions  . Compazine [Prochlorperazine] Shortness Of Breath  . Contrast Media [Iodinated Diagnostic Agents] Hives   Recent Results (from the past 2160 hour(s))  Novel Coronavirus, NAA ([facility])     Status: None   Collection Time: 10/18/19 10:39 AM   Specimen: Nasopharyngeal(NP) swabs in vial transport medium   NASOPHARYNGE  TESTING  Result Value Ref Range   SARS-CoV-2, NAA Not Detected Not Detected    Comment: This nucleic acid amplification test was developed and its performance characteristics determined by [employer]. Nucleic acid amplification tests include RT-PCR and TMA. This test has not been FDA cleared or approved. This test has been authorized by FDA under an Emergency Use Authorization (EUA). This test is only authorized for the duration of time the declaration that circumstances exist justifying the authorization of the emergency use of in vitro diagnostic tests for detection of SARS-CoV-2 virus and/or diagnosis of COVID-19 infection under section 564(b)(1) of the Act, 21 U.S.C. [id number]) (1), unless the authorization is terminated or revoked sooner. When diagnostic testing is negative, the possibility of a  false negative result should be considered in the context of a patient's recent exposures and the presence of clinical signs and symptoms consistent with COVID-19. An individual without symptoms of COVID-19 and who is not shedding SARS-CoV-2 virus wo uld expect to have a negative (not detected) result in this assay.   Cervicovaginal ancillary only( [place])     Status: Abnormal   Collection Time: 12/07/19 10:36 AM  Result Value Ref Range   Candida Vaginitis Negative  Candida Glabrata Negative    Trichomonas Negative    Bacterial Vaginitis (gardnerella) Positive (A)    Comment Normal Reference Range Candida Species - Negative    Comment Normal Reference Range Candida Galbrata - Negative    Comment Normal Reference Range Trichomonas - Negative    Comment      Normal Reference Range Bacterial Vaginosis - Negative  Cytology - PAP( [place])     Status: None   Collection Time: 12/07/19 10:36 AM  Result Value Ref Range   High risk HPV Negative    Adequacy      Satisfactory for evaluation; transformation zone component PRESENT.   Diagnosis      - Negative for intraepithelial lesion or malignancy (NILM)   Comment Normal Reference Range HPV - Negative    Objective  Body mass index is 37.45 kg/m. Wt Readings from Last 3 Encounters:  12/28/19 232 lb (105.2 kg)  12/07/19 227 lb 3.2 oz (103.1 kg)  10/12/19 229 lb (103.9 kg)   Temp Readings from Last 3 Encounters:  12/28/19 (!) 97 F (36.1 C) (Temporal)  12/07/19 (!) 97.3 F (36.3 C) (Temporal)  10/02/19 (!) 96.6 F (35.9 C)   BP Readings from Last 3 Encounters:  12/28/19 130/82  12/07/19 126/82  10/02/19 121/75   Pulse Readings from Last 3 Encounters:  12/28/19 81  12/07/19 83  10/02/19 82    Physical Exam Vitals and nursing note reviewed.  Constitutional:      Appearance: Normal appearance. She is well-developed and well-groomed. She is obese.  HENT:     Head: Normocephalic and atraumatic.  Eyes:      Conjunctiva/sclera: Conjunctivae normal.     Pupils: Pupils are equal, round, and reactive to light.  Cardiovascular:     Rate and Rhythm: Normal rate and regular rhythm.     Heart sounds: Normal heart sounds. No murmur.  Pulmonary:     Effort: Pulmonary effort is normal.     Breath sounds: Normal breath sounds.  Skin:    General: Skin is warm and dry.  Neurological:     General: No focal deficit present.     Mental Status: She is alert and oriented to person, place, and time. Mental status is at baseline.     Gait: Gait normal.     Comments: CN 2-12 grossly intact   Psychiatric:        Attention and Perception: Attention and perception normal.        Mood and Affect: Mood and affect normal.        Speech: Speech normal.        Behavior: Behavior normal. Behavior is cooperative.        Thought Content: Thought content normal.        Cognition and Memory: Cognition and memory normal.        Judgment: Judgment normal.     Comments: +crying on exam      Assessment  Plan  Intractable migraine with status migrainosus, unspecified migraine type - Plan: butorphanol (STADOL) 10 MG/ML nasal spray, promethazine (PHENERGAN) 12.5-25 MG tablet Declines benadryl, toradol and depomedrol and zofran for now has to go to funeral home for viewing of uncles body and only this has helped before and see HPI  If h/a continue consider Mri brain given h/o right sided benign brain tumor  Grief reaction - Plan: ALPRAZolam (XANAX) 0.25 MG tablet  Insomnia, unspecified type - Plan: ALPRAZolam (XANAX) 0.25 MG tablet  Anxiety - Plan: ALPRAZolam Duanne Moron)  0.25 MG tablet   Provider: Dr. Olivia Mackie McLean-Scocuzza-Internal Medicine

## 2019-12-28 NOTE — Patient Instructions (Addendum)
Magnesium 400 mg daily  Tylenol with caffeine and aleve as needed  Or excedrine migraine   Migraine Headache A migraine headache is an intense, throbbing pain on one side or both sides of the head. Migraine headaches may also cause other symptoms, such as nausea, vomiting, and sensitivity to light and noise. A migraine headache can last from 4 hours to 3 days. Talk with your doctor about what things may bring on (trigger) your migraine headaches. What are the causes? The exact cause of this condition is not known. However, a migraine may be caused when nerves in the brain become irritated and release chemicals that cause inflammation of blood vessels. This inflammation causes pain. This condition may be triggered or caused by:  Drinking alcohol.  Smoking.  Taking medicines, such as: ? Medicine used to treat chest pain (nitroglycerin). ? Birth control pills. ? Estrogen. ? Certain blood pressure medicines.  Eating or drinking products that contain nitrates, glutamate, aspartame, or tyramine. Aged cheeses, chocolate, or caffeine may also be triggers.  Doing physical activity. Other things that may trigger a migraine headache include:  Menstruation.  Pregnancy.  Hunger.  Stress.  Lack of sleep or too much sleep.  Weather changes.  Fatigue. What increases the risk? The following factors may make you more likely to experience migraine headaches:  Being a certain age. This condition is more common in people who are 66-69 years old.  Being female.  Having a family history of migraine headaches.  Being Caucasian.  Having a mental health condition, such as depression or anxiety.  Being obese. What are the signs or symptoms? The main symptom of this condition is pulsating or throbbing pain. This pain may:  Happen in any area of the head, such as on one side or both sides.  Interfere with daily activities.  Get worse with physical activity.  Get worse with exposure to  bright lights or loud noises. Other symptoms may include:  Nausea.  Vomiting.  Dizziness.  General sensitivity to bright lights, loud noises, or smells. Before you get a migraine headache, you may get warning signs (an aura). An aura may include:  Seeing flashing lights or having blind spots.  Seeing bright spots, halos, or zigzag lines.  Having tunnel vision or blurred vision.  Having numbness or a tingling feeling.  Having trouble talking.  Having muscle weakness. Some people have symptoms after a migraine headache (postdromal phase), such as:  Feeling tired.  Difficulty concentrating. How is this diagnosed? A migraine headache can be diagnosed based on:  Your symptoms.  A physical exam.  Tests, such as: ? CT scan or an MRI of the head. These imaging tests can help rule out other causes of headaches. ? Taking fluid from the spine (lumbar puncture) and analyzing it (cerebrospinal fluid analysis, or CSF analysis). How is this treated? This condition may be treated with medicines that:  Relieve pain.  Relieve nausea.  Prevent migraine headaches. Treatment for this condition may also include:  Acupuncture.  Lifestyle changes like avoiding foods that trigger migraine headaches.  Biofeedback.  Cognitive behavioral therapy. Follow these instructions at home: Medicines  Take over-the-counter and prescription medicines only as told by your health care provider.  Ask your health care provider if the medicine prescribed to you: ? Requires you to avoid driving or using heavy machinery. ? Can cause constipation. You may need to take these actions to prevent or treat constipation:  Drink enough fluid to keep your urine pale yellow.  Take  over-the-counter or prescription medicines.  Eat foods that are high in fiber, such as beans, whole grains, and fresh fruits and vegetables.  Limit foods that are high in fat and processed sugars, such as fried or sweet  foods. Lifestyle  Do not drink alcohol.  Do not use any products that contain nicotine or tobacco, such as cigarettes, e-cigarettes, and chewing tobacco. If you need help quitting, ask your health care provider.  Get at least 8 hours of sleep every night.  Find ways to manage stress, such as meditation, deep breathing, or yoga. General instructions      Keep a journal to find out what may trigger your migraine headaches. For example, write down: ? What you eat and drink. ? How much sleep you get. ? Any change to your diet or medicines.  If you have a migraine headache: ? Avoid things that make your symptoms worse, such as bright lights. ? It may help to lie down in a dark, quiet room. ? Do not drive or use heavy machinery. ? Ask your health care provider what activities are safe for you while you are experiencing symptoms.  Keep all follow-up visits as told by your health care provider. This is important. Contact a health care provider if:  You develop symptoms that are different or more severe than your usual migraine headache symptoms.  You have more than 15 headache days in one month. Get help right away if:  Your migraine headache becomes severe.  Your migraine headache lasts longer than 72 hours.  You have a fever.  You have a stiff neck.  You have vision loss.  Your muscles feel weak or like you cannot control them.  You start to lose your balance often.  You have trouble walking.  You faint.  You have a seizure. Summary  A migraine headache is an intense, throbbing pain on one side or both sides of the head. Migraines may also cause other symptoms, such as nausea, vomiting, and sensitivity to light and noise.  This condition may be treated with medicines and lifestyle changes. You may also need to avoid certain things that trigger a migraine headache.  Keep a journal to find out what may trigger your migraine headaches.  Contact your health care  provider if you have more than 15 headache days in a month or you develop symptoms that are different or more severe than your usual migraine headache symptoms. This information is not intended to replace advice given to you by your health care provider. Make sure you discuss any questions you have with your health care provider. Document Revised: 01/06/2019 Document Reviewed: 10/27/2018 Elsevier Patient Education  [place].

## 2019-12-29 ENCOUNTER — Telehealth: Payer: Self-pay | Admitting: Family Medicine

## 2019-12-29 ENCOUNTER — Encounter: Payer: Self-pay | Admitting: Internal Medicine

## 2019-12-29 DIAGNOSIS — G43911 Migraine, unspecified, intractable, with status migrainosus: Secondary | ICD-10-CM

## 2019-12-29 MED ORDER — BUTORPHANOL TARTRATE 10 MG/ML NA SOLN: 1.0000 | Freq: Once | NASAL | 0 refills | Status: DC | PRN

## 2019-12-29 NOTE — Telephone Encounter (Signed)
Received call from call service on this patient. Her pharmacy did not have the stadol for her. Note reviewed from PCP visit. Stadol sent to pharmacy the RN confirmed had this in stock.

## 2019-12-31 NOTE — Telephone Encounter (Signed)
Noted  TMS 

## 2020-01-28 ENCOUNTER — Emergency Department: Payer: Worker's Compensation

## 2020-01-28 ENCOUNTER — Other Ambulatory Visit: Payer: Self-pay

## 2020-01-28 ENCOUNTER — Encounter: Payer: Self-pay | Admitting: Emergency Medicine

## 2020-01-28 ENCOUNTER — Emergency Department
Admission: EM | Admit: 2020-01-28 | Discharge: 2020-01-28 | Disposition: A | Discharge status: Home / Self Care | Payer: Worker's Compensation | Attending: Emergency Medicine | Admitting: Emergency Medicine

## 2020-01-28 DIAGNOSIS — Z79899 Other long term (current) drug therapy: Secondary | ICD-10-CM | POA: Diagnosis not present

## 2020-01-28 DIAGNOSIS — Z87891 Personal history of nicotine dependence: Secondary | ICD-10-CM | POA: Diagnosis not present

## 2020-01-28 DIAGNOSIS — S66801A Unspecified injury of other specified muscles, fascia and tendons at wrist and hand level, right hand, initial encounter: Secondary | ICD-10-CM | POA: Diagnosis present

## 2020-01-28 DIAGNOSIS — Y939 Activity, unspecified: Secondary | ICD-10-CM | POA: Diagnosis not present

## 2020-01-28 DIAGNOSIS — X509XXA Other and unspecified overexertion or strenuous movements or postures, initial encounter: Secondary | ICD-10-CM | POA: Diagnosis not present

## 2020-01-28 DIAGNOSIS — Y929 Unspecified place or not applicable: Secondary | ICD-10-CM | POA: Diagnosis not present

## 2020-01-28 DIAGNOSIS — Y990 Civilian activity done for income or pay: Secondary | ICD-10-CM | POA: Diagnosis not present

## 2020-01-28 MED ORDER — TRAMADOL HCL 50 MG PO TABS: 50.0000 mg | ORAL_TABLET | Freq: Four times a day (QID) | ORAL | 0 refills | Status: AC | PRN

## 2020-01-28 NOTE — ED Notes (Signed)
Pt unable to sign for d/c d/t computer not working- pt verbalized understanding of discharge

## 2020-01-28 NOTE — ED Notes (Signed)
See triage note  Presents with injury to right index finger    States she was pulling down a curtain and a door came back onto finger

## 2020-01-28 NOTE — ED Triage Notes (Signed)
First nurse note- pt hurt finger pulling curtain in truck. 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

## 2020-01-28 NOTE — ED Provider Notes (Signed)
Emergency Department Provider Note  ____________________________________________  Time seen: Approximately 4:17 PM  I have reviewed the triage vital signs and the nursing notes.   HISTORY  Chief Complaint Finger Injury   Historian Patient     HPI Carmen Cooley is a 54 y.o. female presents to the emergency department with pain along the right index finger.  Patient reports that she caught her finger in a curtain cord on her truck and has had difficulty with flexion at the PIP joint since injury occurred.  She states that she has had some tingling in the forearm.  No abrasions or lacerations.  No similar injuries in the past.  No other alleviating measures have been attempted.   Past Medical History:  Diagnosis Date  . Allergy   . History of blood transfusion   . Migraines   . Seizure ([place])   . UTI (urinary tract infection)      Immunizations up to date:  Yes.     Past Medical History:  Diagnosis Date  . Allergy   . History of blood transfusion   . Migraines   . Seizure ([place])   . UTI (urinary tract infection)     Patient Active Problem List   Diagnosis Date Noted  . Cervical polyp 12/07/2019  . Intertrigo 12/07/2019  . Onychomycosis 10/12/2019  . Annual physical exam 09/07/2019  . Leukocytosis 09/07/2019  . Tinea pedis of both feet 09/07/2019  . Hyperlipidemia 11/30/2018  . Thyroid nodule 11/30/2018  . Iron deficiency 11/30/2018  . Vitamin D deficiency 11/03/2018  . Upper back pain on left side 11/02/2018  . Goiter 11/02/2018  . Cough 11/02/2018  . Chronic left shoulder pain 11/02/2018  . Tobacco abuse 11/02/2018    Past Surgical History:  Procedure Laterality Date  . 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 CESAREAN SECTION     x 1  . COLONOSCOPY WITH PROPOFOL N/A 11/16/2018   Procedure: COLONOSCOPY WITH PROPOFOL;  Surgeon: Jonathon Bellows, MD;  Location: [facility] ENDOSCOPY;  Service: Gastroenterology;  Laterality: N/A;  . FLEXIBLE  SIGMOIDOSCOPY N/A 05/25/2019   Procedure: FLEXIBLE SIGMOIDOSCOPY;  Surgeon: Jonathon Bellows, MD;  Location: [facility] ENDOSCOPY;  Service: Gastroenterology;  Laterality: N/A;  . FOOT SURGERY    . KNEE SURGERY      Prior to Admission medications   Medication Sig Start Date End Date Taking? Authorizing Provider  ALPRAZolam (XANAX) 0.25 MG tablet Take 1 tablet (0.25 mg total) by mouth at bedtime as needed for anxiety. 12/28/19   McLean-Scocuzza, Nino Glow, MD  ammonium lactate (AMLACTIN) 12 % lotion Apply 1 application topically as needed for dry skin. 09/12/19   Felipa Furnace, DPM  butorphanol (STADOL) 10 MG/ML nasal spray Place 1 spray into the nose once as needed for up to 1 dose for headache. May repeat in 60-90 min then Q4-6 hrs x 2-4 days 12/29/19   Leone Haven, MD  Cholecalciferol (VITAMIN D3) 2400 UNIT/ML LIQD by Does not apply route daily. Patient takes 15 drops under tongue daily.    [provider]  hydrocortisone 2.5 % cream Apply topically 2 (two) times daily. Prn right breast 10/12/19   McLean-Scocuzza, Nino Glow, MD  miconazole (MICATIN) 2 % cream Apply 1 application topically 2 (two) times daily. 10/12/19   McLean-Scocuzza, Nino Glow, MD  pantoprazole (PROTONIX) 40 MG tablet Take 1 tablet (40 mg total) by mouth daily. 30 minutes before food 03/30/19   McLean-Scocuzza, Nino Glow, MD  terbinafine (LAMISIL) 250 MG tablet Take 1 tablet (250 mg total) by mouth daily. 09/14/19   Felipa Furnace, DPM  traMADol (ULTRAM) 50 MG tablet Take 1 tablet (50 mg total) by mouth every 6 (six) hours as needed for up to 3 days. 01/28/20 01/31/20  Lannie Fields, PA-C    Allergies Compazine [prochlorperazine] and Contrast media [iodinated diagnostic agents]  Family History  Problem Relation Age of Onset  . Diabetes Father   . Cancer Father        sinus   . Hypertension Father   . Cancer Other   . Cancer Mother        pancreatic/whipple/chemo  . Hypertension Mother   . Cancer Maternal Grandmother         lung smoker  . Cancer Paternal Grandmother        elbow per pt     Social History Social History   Tobacco Use  . Smoking status: Former Smoker    Packs/day: 0.50    Years: 33.00    Pack years: 16.50    Quit date: 09/28/2018    Years since quitting: 1.3  . Smokeless tobacco: Never Used  Substance Use Topics  . Alcohol use: Not Currently    Comment: occasional  . Drug use: No     Review of Systems  Constitutional: No fever/chills Eyes:  No discharge ENT: No upper respiratory complaints. Respiratory: no cough. No SOB/ use of accessory muscles to breath Gastrointestinal:   No nausea, no vomiting.  No diarrhea.  No constipation. Musculoskeletal: Patient has right index finger pain.  Skin: Negative for rash, abrasions, lacerations, ecchymosis.    ____________________________________________   PHYSICAL EXAM:  VITAL SIGNS: ED Triage Vitals  Enc Vitals Group     BP 01/28/20 1436 121/73     Pulse Rate 01/28/20 1436 79     Resp 01/28/20 1436 16     Temp 01/28/20 1436 98.2 F (36.8 C)     Temp Source 01/28/20 1436 Oral     SpO2 01/28/20 1436 95 %     Weight 01/28/20 1438 231 lb (104.8 kg)     Height 01/28/20 1438 5\' 6"  (1.676 m)     Head Circumference --      Peak Flow --      Pain Score 01/28/20 1437 9     Pain Loc --      Pain Edu? --      Excl. in [place]? --      Constitutional: Alert and oriented. Well appearing and in no acute distress. Eyes: Conjunctivae are normal. PERRL. EOMI. Head: Atraumatic. Cardiovascular: Normal rate, regular rhythm. Normal S1 and S2.  Good peripheral circulation. Respiratory: Normal respiratory effort without tachypnea or retractions. Lungs CTAB. Good air entry to the bases with no decreased or absent breath sounds Gastrointestinal: Bowel sounds x 4 quadrants. Soft and nontender to palpation. No guarding or rigidity. No distention. Musculoskeletal: Patient has some notable flexor tendon deficits appreciated with testing at the right  index finger.  Palpable radial pulse, right. Neurologic:  Normal for age. No gross focal neurologic deficits are appreciated.  Skin:  Skin is warm, dry and intact. No rash noted. Psychiatric: Mood and affect are normal for age. Speech and behavior are normal.   ____________________________________________   LABS (all labs ordered are listed, but only abnormal results are displayed)  Labs Reviewed - No data to display ____________________________________________  EKG   ____________________________________________  RADIOLOGY Unk Pinto, personally viewed and  evaluated these images (plain radiographs) as part of my medical decision making, as well as reviewing the written report by the radiologist.  DG Finger Index Right  Result Date: 01/28/2020 CLINICAL DATA:  Pain EXAM: RIGHT INDEX FINGER 2+V COMPARISON:  None. FINDINGS: There is no evidence of fracture or dislocation. There is no evidence of arthropathy or other focal bone abnormality. Soft tissues are unremarkable. IMPRESSION: Negative. Electronically Signed   By: Constance Holster M.D.   On: 01/28/2020 15:28    ____________________________________________    PROCEDURES  Procedure(s) performed:     Procedures     Medications - No data to display   ____________________________________________   INITIAL IMPRESSION / ASSESSMENT AND PLAN / ED COURSE  Pertinent labs & imaging results that were available during my care of the patient were reviewed by me and considered in my medical decision making (see chart for details).    Assessment and plan Flexor tendon injury 54 year old female presents to the emergency department with right index finger pain.  X-ray examination of the right index finger reveals no bony abnormality.  I suspect flexor tendon injury.  Patient was placed in a finger splint and tramadol was prescribed for pain.  She was advised to follow-up with hand specialist, Dr. Peggye Ley.  Return precautions  were given to return with new or worsening symptoms.  ____________________________________________  FINAL CLINICAL IMPRESSION(S) / ED DIAGNOSES  Final diagnoses:  Injury of flexor tendon of right hand, initial encounter      NEW MEDICATIONS STARTED DURING THIS VISIT:  ED Discharge Orders         Ordered    traMADol (ULTRAM) 50 MG tablet  Every 6 hours PRN     01/28/20 1615              This chart was dictated using voice recognition software/Dragon. Despite best efforts to proofread, errors can occur which can change the meaning. Any change was purely unintentional.     Lannie Fields, PA-C 01/28/20 1830    Carrie Mew, MD 01/29/20 [id number]

## 2020-01-28 NOTE — ED Triage Notes (Signed)
Pt to ED via POV, pt states that she was at work yesterday and hurt her finger while pulling down curtain in truck. Pt states that right index finger is very painful and that it hurts to bend. Pt is in NAD.

## 2020-04-02 ENCOUNTER — Inpatient Hospital Stay: Payer: BC Managed Care – PPO

## 2020-04-02 ENCOUNTER — Inpatient Hospital Stay: Payer: BC Managed Care – PPO | Admitting: Oncology

## 2020-06-13 ENCOUNTER — Encounter: Payer: Self-pay | Admitting: Internal Medicine

## 2020-06-13 ENCOUNTER — Ambulatory Visit (INDEPENDENT_AMBULATORY_CARE_PROVIDER_SITE_OTHER): Payer: Self-pay | Admitting: Internal Medicine

## 2020-06-13 ENCOUNTER — Other Ambulatory Visit: Payer: Self-pay

## 2020-06-13 VITALS — BP 122/84 | HR 77 | Temp 98.0°F | Ht 66.0 in | Wt 250.6 lb

## 2020-06-13 DIAGNOSIS — F4321 Adjustment disorder with depressed mood: Secondary | ICD-10-CM

## 2020-06-13 DIAGNOSIS — Z1231 Encounter for screening mammogram for malignant neoplasm of breast: Secondary | ICD-10-CM

## 2020-06-13 DIAGNOSIS — K2970 Gastritis, unspecified, without bleeding: Secondary | ICD-10-CM

## 2020-06-13 DIAGNOSIS — M545 Low back pain, unspecified: Secondary | ICD-10-CM

## 2020-06-13 DIAGNOSIS — G930 Cerebral cysts: Secondary | ICD-10-CM

## 2020-06-13 DIAGNOSIS — R030 Elevated blood-pressure reading, without diagnosis of hypertension: Secondary | ICD-10-CM

## 2020-06-13 DIAGNOSIS — B351 Tinea unguium: Secondary | ICD-10-CM

## 2020-06-13 DIAGNOSIS — G5601 Carpal tunnel syndrome, right upper limb: Secondary | ICD-10-CM

## 2020-06-13 DIAGNOSIS — Z6841 Body Mass Index (BMI) 40.0 and over, adult: Secondary | ICD-10-CM

## 2020-06-13 DIAGNOSIS — R519 Headache, unspecified: Secondary | ICD-10-CM

## 2020-06-13 DIAGNOSIS — K921 Melena: Secondary | ICD-10-CM

## 2020-06-13 DIAGNOSIS — G43911 Migraine, unspecified, intractable, with status migrainosus: Secondary | ICD-10-CM

## 2020-06-13 DIAGNOSIS — E559 Vitamin D deficiency, unspecified: Secondary | ICD-10-CM

## 2020-06-13 DIAGNOSIS — R739 Hyperglycemia, unspecified: Secondary | ICD-10-CM

## 2020-06-13 DIAGNOSIS — E119 Type 2 diabetes mellitus without complications: Secondary | ICD-10-CM

## 2020-06-13 DIAGNOSIS — H6123 Impacted cerumen, bilateral: Secondary | ICD-10-CM

## 2020-06-13 DIAGNOSIS — M25511 Pain in right shoulder: Secondary | ICD-10-CM

## 2020-06-13 DIAGNOSIS — G8929 Other chronic pain: Secondary | ICD-10-CM

## 2020-06-13 DIAGNOSIS — F419 Anxiety disorder, unspecified: Secondary | ICD-10-CM

## 2020-06-13 DIAGNOSIS — R490 Dysphonia: Secondary | ICD-10-CM

## 2020-06-13 DIAGNOSIS — G4700 Insomnia, unspecified: Secondary | ICD-10-CM

## 2020-06-13 DIAGNOSIS — E538 Deficiency of other specified B group vitamins: Secondary | ICD-10-CM

## 2020-06-13 DIAGNOSIS — Z1389 Encounter for screening for other disorder: Secondary | ICD-10-CM

## 2020-06-13 DIAGNOSIS — M25512 Pain in left shoulder: Secondary | ICD-10-CM

## 2020-06-13 DIAGNOSIS — E042 Nontoxic multinodular goiter: Secondary | ICD-10-CM | POA: Insufficient documentation

## 2020-06-13 DIAGNOSIS — Z9889 Other specified postprocedural states: Secondary | ICD-10-CM

## 2020-06-13 DIAGNOSIS — K429 Umbilical hernia without obstruction or gangrene: Secondary | ICD-10-CM | POA: Insufficient documentation

## 2020-06-13 LAB — COMPREHENSIVE METABOLIC PANEL
ALT: 24 U/L (ref 0–35)
AST: 19 U/L (ref 0–37)
Albumin: 4.6 g/dL (ref 3.5–5.2)
Alkaline Phosphatase: 61 U/L (ref 39–117)
BUN: 16 mg/dL (ref 6–23)
CO2: 28 mEq/L (ref 19–32)
Calcium: 9.9 mg/dL (ref 8.4–10.5)
Chloride: 104 mEq/L (ref 96–112)
Creatinine, Ser: 0.88 mg/dL (ref 0.40–1.20)
GFR: 80.78 mL/min (ref >60.00)
Glucose, Bld: 105 mg/dL — ABNORMAL HIGH (ref 70–99)
Potassium: 4.5 mEq/L (ref 3.5–5.1)
Sodium: 138 mEq/L (ref 135–145)
Total Bilirubin: 0.3 mg/dL (ref 0.2–1.2)
Total Protein: 7.9 g/dL (ref 6.0–8.3)

## 2020-06-13 LAB — CBC WITH DIFFERENTIAL/PLATELET
Basophils Absolute: 0.1 10*3/uL (ref 0.0–0.1)
Basophils Relative: 0.5 % (ref 0.0–3.0)
Eosinophils Absolute: 0.4 10*3/uL (ref 0.0–0.7)
Eosinophils Relative: 2.9 % (ref 0.0–5.0)
HCT: 40.3 % (ref 36.0–46.0)
Hemoglobin: 13 g/dL (ref 12.0–15.0)
Lymphocytes Relative: 45 % (ref 12.0–46.0)
Lymphs Abs: 5.7 10*3/uL — ABNORMAL HIGH (ref 0.7–4.0)
MCHC: 32.3 g/dL (ref 30.0–36.0)
MCV: 83.2 fl (ref 78.0–100.0)
Monocytes Absolute: 0.8 10*3/uL (ref 0.1–1.0)
Monocytes Relative: 6.3 % (ref 3.0–12.0)
Neutro Abs: 5.7 10*3/uL (ref 1.4–7.7)
Neutrophils Relative %: 45.3 % (ref 43.0–77.0)
Platelets: 269 10*3/uL (ref 150.0–400.0)
RBC: 4.84 Mil/uL (ref 3.87–5.11)
RDW: 15.5 % (ref 11.5–15.5)
WBC: 12.6 10*3/uL — ABNORMAL HIGH (ref 4.0–10.5)

## 2020-06-13 LAB — LIPID PANEL
Cholesterol: 173 mg/dL (ref 0–200)
HDL: 49.1 mg/dL (ref >39.00)
LDL Cholesterol: 88 mg/dL (ref 0–99)
NonHDL: 124.39
Total CHOL/HDL Ratio: 4
Triglycerides: 180 mg/dL — ABNORMAL HIGH (ref 0.0–149.0)
VLDL: 36 mg/dL (ref 0.0–40.0)

## 2020-06-13 LAB — VITAMIN D 25 HYDROXY (VIT D DEFICIENCY, FRACTURES): VITD: 20.42 ng/mL — ABNORMAL LOW (ref 30.00–100.00)

## 2020-06-13 LAB — HEMOGLOBIN A1C: Hgb A1c MFr Bld: 6.9 % — ABNORMAL HIGH (ref 4.6–6.5)

## 2020-06-13 LAB — VITAMIN B12: Vitamin B-12: 551 pg/mL (ref 211–911)

## 2020-06-13 LAB — TSH: TSH: 1.31 u[IU]/mL (ref 0.35–4.50)

## 2020-06-13 MED ORDER — PANTOPRAZOLE SODIUM 40 MG PO TBEC: 40.0000 mg | DELAYED_RELEASE_TABLET | Freq: Every day | ORAL | 3 refills | Status: DC

## 2020-06-13 MED ORDER — BUTORPHANOL TARTRATE 10 MG/ML NA SOLN: 1.0000 | Freq: Once | NASAL | 2 refills | Status: DC | PRN

## 2020-06-13 MED ORDER — TERBINAFINE HCL 250 MG PO TABS: 250.0000 mg | ORAL_TABLET | Freq: Every day | ORAL | 0 refills | Status: DC

## 2020-06-13 MED ORDER — ALPRAZOLAM 0.25 MG PO TABS: 0.2500 mg | ORAL_TABLET | Freq: Every evening | ORAL | 0 refills | Status: DC | PRN

## 2020-06-13 NOTE — Patient Instructions (Addendum)
Monitor blood pressure <130/<80   Low Back Sprain or Strain Rehab Ask your health care provider which exercises are safe for you. Do exercises exactly as told by your health care provider and adjust them as directed. It is normal to feel mild stretching, pulling, tightness, or discomfort as you do these exercises. Stop right away if you feel sudden pain or your pain gets worse. Do not begin these exercises until told by your health care provider. Stretching and range-of-motion exercises These exercises warm up your muscles and joints and improve the movement and flexibility of your back. These exercises also help to relieve pain, numbness, and tingling. Lumbar rotation  1. Lie on your back on a firm surface and bend your knees. 2. Straighten your arms out to your sides so each arm forms a 90-degree angle (right angle) with a side of your body. 3. Slowly move (rotate) both of your knees to one side of your body until you feel a stretch in your lower back (lumbar). Try not to let your shoulders lift off the floor. 4. Hold this position for __________ seconds. 5. Tense your abdominal muscles and slowly move your knees back to the starting position. 6. Repeat this exercise on the other side of your body. Repeat __________ times. Complete this exercise __________ times a day. Single knee to chest  1. Lie on your back on a firm surface with both legs straight. 2. Bend one of your knees. Use your hands to move your knee up toward your chest until you feel a gentle stretch in your lower back and buttock. ? Hold your leg in this position by holding on to the front of your knee. ? Keep your other leg as straight as possible. 3. Hold this position for __________ seconds. 4. Slowly return to the starting position. 5. Repeat with your other leg. Repeat __________ times. Complete this exercise __________ times a day. Prone extension on elbows  1. Lie on your abdomen on a firm surface (prone  position). 2. Prop yourself up on your elbows. 3. Use your arms to help lift your chest up until you feel a gentle stretch in your abdomen and your lower back. ? This will place some of your body weight on your elbows. If this is uncomfortable, try stacking pillows under your chest. ? Your hips should stay down, against the surface that you are lying on. Keep your hip and back muscles relaxed. 4. Hold this position for __________ seconds. 5. Slowly relax your upper body and return to the starting position. Repeat __________ times. Complete this exercise __________ times a day. Strengthening exercises These exercises build strength and endurance in your back. Endurance is the ability to use your muscles for a long time, even after they get tired. Pelvic tilt This exercise strengthens the muscles that lie deep in the abdomen. 1. Lie on your back on a firm surface. Bend your knees and keep your feet flat on the floor. 2. Tense your abdominal muscles. Tip your pelvis up toward the ceiling and flatten your lower back into the floor. ? To help with this exercise, you may place a small towel under your lower back and try to push your back into the towel. 3. Hold this position for __________ seconds. 4. Let your muscles relax completely before you repeat this exercise. Repeat __________ times. Complete this exercise __________ times a day. Alternating arm and leg raises  1. Get on your hands and knees on a firm surface. If you  are on a hard floor, you may want to use padding, such as an exercise mat, to cushion your knees. 2. Line up your arms and legs. Your hands should be directly below your shoulders, and your knees should be directly below your hips. 3. Lift your left leg behind you. At the same time, raise your right arm and straighten it in front of you. ? Do not lift your leg higher than your hip. ? Do not lift your arm higher than your shoulder. ? Keep your abdominal and back muscles  tight. ? Keep your hips facing the ground. ? Do not arch your back. ? Keep your balance carefully, and do not hold your breath. 4. Hold this position for __________ seconds. 5. Slowly return to the starting position. 6. Repeat with your right leg and your left arm. Repeat __________ times. Complete this exercise __________ times a day. Abdominal set with straight leg raise  1. Lie on your back on a firm surface. 2. Bend one of your knees and keep your other leg straight. 3. Tense your abdominal muscles and lift your straight leg up, 4-6 inches (10-15 cm) off the ground. 4. Keep your abdominal muscles tight and hold this position for __________ seconds. ? Do not hold your breath. ? Do not arch your back. Keep it flat against the ground. 5. Keep your abdominal muscles tense as you slowly lower your leg back to the starting position. 6. Repeat with your other leg. Repeat __________ times. Complete this exercise __________ times a day. Single leg lower with bent knees 1. Lie on your back on a firm surface. 2. Tense your abdominal muscles and lift your feet off the floor, one foot at a time, so your knees and hips are bent in 90-degree angles (right angles). ? Your knees should be over your hips and your lower legs should be parallel to the floor. 3. Keeping your abdominal muscles tense and your knee bent, slowly lower one of your legs so your toe touches the ground. 4. Lift your leg back up to return to the starting position. ? Do not hold your breath. ? Do not let your back arch. Keep your back flat against the ground. 5. Repeat with your other leg. Repeat __________ times. Complete this exercise __________ times a day. Posture and body mechanics Good posture and healthy body mechanics can help to relieve stress in your body's tissues and joints. Body mechanics refers to the movements and positions of your body while you do your daily activities. Posture is part of body mechanics. Good  posture means:  Your spine is in its natural S-curve position (neutral).  Your shoulders are pulled back slightly.  Your head is not tipped forward. Follow these guidelines to improve your posture and body mechanics in your everyday activities. Standing   When standing, keep your spine neutral and your feet about hip width apart. Keep a slight bend in your knees. Your ears, shoulders, and hips should line up.  When you do a task in which you stand in one place for a long time, place one foot up on a stable object that is 2-4 inches (5-10 cm) high, such as a footstool. This helps keep your spine neutral. Sitting   When sitting, keep your spine neutral and keep your feet flat on the floor. Use a footrest, if necessary, and keep your thighs parallel to the floor. Avoid rounding your shoulders, and avoid tilting your head forward.  When working at a desk  or a computer, keep your desk at a height where your hands are slightly lower than your elbows. Slide your chair under your desk so you are close enough to maintain good posture.  When working at a computer, place your monitor at a height where you are looking straight ahead and you do not have to tilt your head forward or downward to look at the screen. Resting  When lying down and resting, avoid positions that are most painful for you.  If you have pain with activities such as sitting, bending, stooping, or squatting, lie in a position in which your body does not bend very much. For example, avoid curling up on your side with your arms and knees near your chest (fetal position).  If you have pain with activities such as standing for a long time or reaching with your arms, lie with your spine in a neutral position and bend your knees slightly. Try the following positions: ? Lying on your side with a pillow between your knees. ? Lying on your back with a pillow under your knees. Lifting   When lifting objects, keep your feet at least  shoulder width apart and tighten your abdominal muscles.  Bend your knees and hips and keep your spine neutral. It is important to lift using the strength of your legs, not your back. Do not lock your knees straight out.  Always ask for help to lift heavy or awkward objects. This information is not intended to replace advice given to you by your health care provider. Make sure you discuss any questions you have with your health care provider. Document Revised: 01/06/2019 Document Reviewed: 10/06/2018 [employer] Patient Education  [place].  Back Exercises The following exercises strengthen the muscles that help to support the trunk and back. They also help to keep the lower back flexible. Doing these exercises can help to prevent back pain or lessen existing pain.  If you have back pain or discomfort, try doing these exercises 2-3 times each day or as told by your health care provider.  As your pain improves, do them once each day, but increase the number of times that you repeat the steps for each exercise (do more repetitions).  To prevent the recurrence of back pain, continue to do these exercises once each day or as told by your health care provider. Do exercises exactly as told by your health care provider and adjust them as directed. It is normal to feel mild stretching, pulling, tightness, or discomfort as you do these exercises, but you should stop right away if you feel sudden pain or your pain gets worse. Exercises Single knee to chest Repeat these steps 3-5 times for each leg: 1. Lie on your back on a firm bed or the floor with your legs extended. 2. Bring one knee to your chest. Your other leg should stay extended and in contact with the floor. 3. Hold your knee in place by grabbing your knee or thigh with both hands and hold. 4. Pull on your knee until you feel a gentle stretch in your lower back or buttocks. 5. Hold the stretch for 10-30 seconds. 6. Slowly release and  straighten your leg. Pelvic tilt Repeat these steps 5-10 times: 1. Lie on your back on a firm bed or the floor with your legs extended. 2. Bend your knees so they are pointing toward the ceiling and your feet are flat on the floor. 3. Tighten your lower abdominal muscles to press your  lower back against the floor. This motion will tilt your pelvis so your tailbone points up toward the ceiling instead of pointing to your feet or the floor. 4. With gentle tension and even breathing, hold this position for 5-10 seconds. Cat-cow Repeat these steps until your lower back becomes more flexible: 1. Get into a hands-and-knees position on a firm surface. Keep your hands under your shoulders, and keep your knees under your hips. You may place padding under your knees for comfort. 2. Let your head hang down toward your chest. Contract your abdominal muscles and point your tailbone toward the floor so your lower back becomes rounded like the back of a cat. 3. Hold this position for 5 seconds. 4. Slowly lift your head, let your abdominal muscles relax and point your tailbone up toward the ceiling so your back forms a sagging arch like the back of a cow. 5. Hold this position for 5 seconds.  Press-ups Repeat these steps 5-10 times: 1. Lie on your abdomen (face-down) on the floor. 2. Place your palms near your head, about shoulder-width apart. 3. Keeping your back as relaxed as possible and keeping your hips on the floor, slowly straighten your arms to raise the top half of your body and lift your shoulders. Do not use your back muscles to raise your upper torso. You may adjust the placement of your hands to make yourself more comfortable. 4. Hold this position for 5 seconds while you keep your back relaxed. 5. Slowly return to lying flat on the floor.  Bridges Repeat these steps 10 times: 1. Lie on your back on a firm surface. 2. Bend your knees so they are pointing toward the ceiling and your feet are  flat on the floor. Your arms should be flat at your sides, next to your body. 3. Tighten your buttocks muscles and lift your buttocks off the floor until your waist is at almost the same height as your knees. You should feel the muscles working in your buttocks and the back of your thighs. If you do not feel these muscles, slide your feet 1-2 inches farther away from your buttocks. 4. Hold this position for 3-5 seconds. 5. Slowly lower your hips to the starting position, and allow your buttocks muscles to relax completely. If this exercise is too easy, try doing it with your arms crossed over your chest. Abdominal crunches Repeat these steps 5-10 times: 1. Lie on your back on a firm bed or the floor with your legs extended. 2. Bend your knees so they are pointing toward the ceiling and your feet are flat on the floor. 3. Cross your arms over your chest. 4. Tip your chin slightly toward your chest without bending your neck. 5. Tighten your abdominal muscles and slowly raise your trunk (torso) high enough to lift your shoulder blades a tiny bit off the floor. Avoid raising your torso higher than that because it can put too much stress on your low back and does not help to strengthen your abdominal muscles. 6. Slowly return to your starting position. Back lifts Repeat these steps 5-10 times: 1. Lie on your abdomen (face-down) with your arms at your sides, and rest your forehead on the floor. 2. Tighten the muscles in your legs and your buttocks. 3. Slowly lift your chest off the floor while you keep your hips pressed to the floor. Keep the back of your head in line with the curve in your back. Your eyes should be looking at  the floor. 4. Hold this position for 3-5 seconds. 5. Slowly return to your starting position. Contact a health care provider if:  Your back pain or discomfort gets much worse when you do an exercise.  Your worsening back pain or discomfort does not lessen within 2 hours after  you exercise. If you have any of these problems, stop doing these exercises right away. Do not do them again unless your health care provider says that you can. Get help right away if:  You develop sudden, severe back pain. If this happens, stop doing the exercises right away. Do not do them again unless your health care provider says that you can. This information is not intended to replace advice given to you by your health care provider. Make sure you discuss any questions you have with your health care provider. Document Revised: 01/19/2019 Document Reviewed: 06/16/2018 Elsevier Patient Education  Byers tunnel syndrome is a condition that causes pain in your hand and arm. The carpal tunnel is a narrow area located on the palm side of your wrist. Repeated wrist motion or certain diseases may cause swelling within the tunnel. This swelling pinches the main nerve in the wrist (median nerve). What are the causes? This condition may be caused by:  Repeated wrist motions.  Wrist injuries.  Arthritis.  A cyst or tumor in the carpal tunnel.  Fluid buildup during pregnancy. Sometimes the cause of this condition is not known. What increases the risk? The following factors may make you more likely to develop this condition:  Having a job, such as being a Research scientist (life sciences), that requires you to repeatedly move your wrist in the same motion.  Being a woman.  Having certain conditions, such as: ? Diabetes. ? Obesity. ? An underactive thyroid (hypothyroidism). ? Kidney failure. What are the signs or symptoms? Symptoms of this condition include:  A tingling feeling in your fingers, especially in your thumb, index, and middle fingers.  Tingling or numbness in your hand.  An aching feeling in your entire arm, especially when your wrist and elbow are bent for a long time.  Wrist pain that goes up your arm to your shoulder.  Pain that  goes down into your palm or fingers.  A weak feeling in your hands. You may have trouble grabbing and holding items. Your symptoms may feel worse during the night. How is this diagnosed? This condition is diagnosed with a medical history and physical exam. You may also have tests, including:  Electromyogram (EMG). This test measures electrical signals sent by your nerves into the muscles.  Nerve conduction study. This test measures how well electrical signals pass through your nerves.  Imaging tests, such as X-rays, ultrasound, and MRI. These tests check for possible causes of your condition. How is this treated? This condition may be treated with:  Lifestyle changes. It is important to stop or change the activity that caused your condition.  Doing exercise and activities to strengthen your muscles and bones (physical therapy).  Learning how to use your hand again after diagnosis (occupational therapy).  Medicines for pain and inflammation. This may include medicine that is injected into your wrist.  A wrist splint.  Surgery. Follow these instructions at home: If you have a splint:  Wear the splint as told by your health care provider. Remove it only as told by your health care provider.  Loosen the splint if your fingers tingle, become numb,  or turn cold and blue.  Keep the splint clean.  If the splint is not waterproof: ? Do not let it get wet. ? Cover it with a watertight covering when you take a bath or shower. Managing pain, stiffness, and swelling   If directed, put ice on the painful area: ? If you have a removable splint, remove it as told by your health care provider. ? Put ice in a plastic bag. ? Place a towel between your skin and the bag. ? Leave the ice on for 20 minutes, 2-3 times per day. General instructions  Take over-the-counter and prescription medicines only as told by your health care provider.  Rest your wrist from any activity that may be  causing your pain. If your condition is work related, talk with your employer about changes that can be made, such as getting a wrist pad to use while typing.  Do any exercises as told by your health care provider, physical therapist, or occupational therapist.  Keep all follow-up visits as told by your health care provider. This is important. Contact a health care provider if:  You have new symptoms.  Your pain is not controlled with medicines.  Your symptoms get worse. Get help right away if:  You have severe numbness or tingling in your wrist or hand. Summary  Carpal tunnel syndrome is a condition that causes pain in your hand and arm.  It is usually caused by repeated wrist motions.  Lifestyle changes and medicines are used to treat carpal tunnel syndrome. Surgery may be recommended.  Follow your health care provider's instructions about wearing a splint, resting from activity, keeping follow-up visits, and calling for help. This information is not intended to replace advice given to you by your health care provider. Make sure you discuss any questions you have with your health care provider. Document Revised: 01/21/2018 Document Reviewed: 01/21/2018 Elsevier Patient Education  [place].

## 2020-06-13 NOTE — Progress Notes (Signed)
Chief Complaint  Patient presents with  . Follow-up  . Shoulder Pain    on and off bilateral shoulder pain    F/u  1. Dm 2 and obesity wt trending down 06/13/20 A1C 6.9  2. C/o trouble with voice with yelling or tingling disc ?ENT vs pulm referral in the future  3. Thyroid nodules due thyroid US 10/03/20  4. 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 Increased cerumen b/l ears removed today tolerated  6. C/o L>R shoulder pain intermittent mild to moderate and ROM affected Also c/o numbness in right hand  Will refer to ortho  Nothing tried  7.toenail fungus completed 1 round of lamisil and helped with podiatry and agreeable to try again   Review of Systems  Constitutional: Negative for weight loss.  HENT: Negative for hearing loss.   Eyes: Negative for blurred vision.  Respiratory: Negative for shortness of breath.   Cardiovascular: Negative for chest pain.  Gastrointestinal: Negative for abdominal pain.  Musculoskeletal: Positive for joint pain.  Skin: Negative for rash.  Neurological: Positive for headaches.  Psychiatric/Behavioral: The patient is nervous/anxious and has insomnia.    Past Medical History:  Diagnosis Date  . Allergy   . DM type 2 (diabetes mellitus, type 2) ([place]) 06/17/2020  . History of blood transfusion   . Migraines   . Seizure ([place])   . UTI (urinary tract infection)    Past Surgical History:  Procedure Laterality Date  . 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 CESAREAN SECTION     x 1  . COLONOSCOPY WITH PROPOFOL N/A 11/16/2018   Procedure: COLONOSCOPY WITH PROPOFOL;  Surgeon: Jonathon Bellows, MD;  Location: [facility] ENDOSCOPY;  Service: Gastroenterology;  Laterality: N/A;  . FLEXIBLE SIGMOIDOSCOPY N/A 05/25/2019   Procedure: FLEXIBLE SIGMOIDOSCOPY;  Surgeon: Jonathon Bellows, MD;  Location: [facility] ENDOSCOPY;  Service: Gastroenterology;  Laterality: N/A;  . FOOT  SURGERY    . KNEE SURGERY     Family History  Problem Relation Age of Onset  . Diabetes Father   . Cancer Father        sinus   . Hypertension Father   . Cancer Other   . Cancer Mother        pancreatic/whipple/chemo  . Hypertension Mother   . Cancer Maternal Grandmother        lung smoker  . Cancer Paternal Grandmother        elbow per pt    Social History   Socioeconomic History  . Marital status: Single    Spouse name: Not on file  . Number of children: Not on file  . Years of education: Not on file  . Highest education level: Not on file  Occupational History  . Not on file  Tobacco Use  . Smoking status: Former Smoker    Packs/day: 0.50    Years: 33.00    Pack years: 16.50    Quit date: 09/28/2018    Years since quitting: 1.7  . Smokeless tobacco: Never Used  Vaping Use  . Vaping Use: Never used  Substance and Sexual Activity  . Alcohol use: Not Currently    Comment: occasional  . Drug use: No  . Sexual activity: Not Currently  Other Topics Concern  . 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 Difficulty of Paying Living Expenses: Not on file  Food Insecurity:   . Worried About Charity fundraiser in the Last Year: Not on file  . Ran Out of Food in the Last Year: Not on file  Transportation Needs:   . Lack of Transportation (Medical): Not on file  . Lack of Transportation (Non-Medical): Not on file  Physical Activity:   . Days of Exercise per Week: Not on file  . Minutes of Exercise per Session: Not on file  Stress:   . Feeling of Stress : Not on file  Social Connections:   . Frequency of Communication with Friends and Family: Not on file  . Frequency of Social Gatherings with Friends and Family: Not on file  . Attends Religious  Services: Not on file  . Active Member of Clubs or Organizations: Not on file  . Attends Archivist Meetings: Not on file  . Marital Status: Not on file  Intimate Partner Violence:   . Fear of Current or Ex-Partner: Not on file  . Emotionally Abused: Not on file  . Physically Abused: Not on file  . Sexually Abused: Not on file   Current Meds  Medication Sig  . ALPRAZolam (XANAX) 0.25 MG tablet Take 1 tablet (0.25 mg total) by mouth at bedtime as needed for anxiety.  Marland Kitchen ammonium lactate (AMLACTIN) 12 % lotion Apply 1 application topically as needed for dry skin.  Marland Kitchen BIOTIN 5000 PO Take by mouth.  . butorphanol (STADOL) 10 MG/ML nasal spray Place 1 spray into the nose once as needed for up to 1 dose for headache. May repeat in 60-90 min then Q4-6 hrs x 2-4 days  . Cholecalciferol (VITAMIN D3) 2400 UNIT/ML LIQD by Does not apply route daily. Patient takes 15 drops under tongue daily.  . hydrocortisone 2.5 % cream Apply topically 2 (two) times daily. Prn right breast  . meloxicam (MOBIC) 15 MG tablet Take 15 mg by mouth daily.  . miconazole (MICATIN) 2 % cream Apply 1 application topically 2 (two) times daily.  . Multiple Vitamin (MULTIVITAMIN) tablet Take 1 tablet by mouth daily.  . pantoprazole (PROTONIX) 40 MG tablet Take 1 tablet (40 mg total) by mouth daily. 30 minutes before food  . terbinafine (LAMISIL) 250 MG tablet Take 1 tablet (250 mg total) by mouth daily.  . [DISCONTINUED] ALPRAZolam (XANAX) 0.25 MG tablet Take 1 tablet (0.25 mg total) by mouth at bedtime as needed for anxiety.  . [DISCONTINUED] butorphanol (STADOL) 10 MG/ML nasal spray Place 1 spray into the nose once as needed for up to 1 dose for headache. May repeat in 60-90 min then Q4-6 hrs x 2-4 days  . [DISCONTINUED] pantoprazole (PROTONIX) 40 MG tablet Take 1 tablet (40 mg total) by mouth daily. 30 minutes before food  . [DISCONTINUED] terbinafine (LAMISIL) 250 MG tablet Take 1 tablet (250 mg total) by mouth daily.    Allergies  Allergen Reactions  . Compazine [Prochlorperazine] Shortness Of Breath  . Contrast Media [Iodinated Diagnostic Agents] Hives   Recent Results (from the past 2160 hour(s))  Comprehensive metabolic panel     Status: Abnormal   Collection Time: 06/13/20  9:33 AM  Result Value Ref Range  Sodium 138 135 - 145 mEq/L   Potassium 4.5 3.5 - 5.1 mEq/L   Chloride 104 96 - 112 mEq/L   CO2 28 19 - 32 mEq/L   Glucose, Bld 105 (H) 70 - 99 mg/dL   BUN 16 6 - 23 mg/dL   Creatinine, Ser 0.88 0.40 - 1.20 mg/dL   Total Bilirubin 0.3 0.2 - 1.2 mg/dL   Alkaline Phosphatase 61 39 - 117 U/L   AST 19 0 - 37 U/L   ALT 24 0 - 35 U/L   Total Protein 7.9 6.0 - 8.3 g/dL   Albumin 4.6 3.5 - 5.2 g/dL   GFR 80.78 >60.00 mL/min   Calcium 9.9 8.4 - 10.5 mg/dL  Lipid panel     Status: Abnormal   Collection Time: 06/13/20  9:33 AM  Result Value Ref Range   Cholesterol 173 0 - 200 mg/dL    Comment: ATP III Classification       Desirable:  < 200 mg/dL               Borderline High:  200 - 239 mg/dL          High:  > = 240 mg/dL   Triglycerides 180.0 (H) 0 - 149 mg/dL    Comment: Normal:  <150 mg/dLBorderline High:  150 - 199 mg/dL   HDL 49.10 >39.00 mg/dL   VLDL 36.0 0.0 - 40.0 mg/dL   LDL Cholesterol 88 0 - 99 mg/dL   Total CHOL/HDL Ratio 4     Comment:                Men          Women1/2 Average Risk     3.4          3.3Average Risk          5.0          4.42X Average Risk          9.6          7.13X Average Risk          15.0          11.0                       NonHDL 124.39     Comment: NOTE:  Non-HDL goal should be 30 mg/dL higher than patient's LDL goal (i.e. LDL goal of < 70 mg/dL, would have non-HDL goal of < 100 mg/dL)  CBC with Differential/Platelet     Status: Abnormal   Collection Time: 06/13/20  9:33 AM  Result Value Ref Range   WBC 12.6 (H) 4.0 - 10.5 K/uL   RBC 4.84 3.87 - 5.11 Mil/uL   Hemoglobin 13.0 12.0 - 15.0 g/dL   HCT 40.3 36 - 46 %   MCV 83.2 78.0 - 100.0 fl   MCHC 32.3  30.0 - 36.0 g/dL   RDW 15.5 11.5 - 15.5 %   Platelets 269.0 150 - 400 K/uL   Neutrophils Relative % 45.3 43 - 77 %   Lymphocytes Relative 45.0 12 - 46 %   Monocytes Relative 6.3 3 - 12 %   Eosinophils Relative 2.9 0 - 5 %   Basophils Relative 0.5 0 - 3 %   Neutro Abs 5.7 1.4 - 7.7 K/uL   Lymphs Abs 5.7 (H) 0.7 - 4.0 K/uL   Monocytes Absolute 0.8 0 - 1 K/uL   Eosinophils Absolute 0.4 0 - 0 K/uL  Basophils Absolute 0.1 0 - 0 K/uL  TSH     Status: None   Collection Time: 06/13/20  9:33 AM  Result Value Ref Range   TSH 1.31 0.35 - 4.50 uIU/mL  Hemoglobin A1c     Status: Abnormal   Collection Time: 06/13/20  9:33 AM  Result Value Ref Range   Hgb A1c MFr Bld 6.9 (H) 4.6 - 6.5 %    Comment: Glycemic Control Guidelines for People with Diabetes:Non Diabetic:  <6%Goal of Therapy: <7%Additional Action Suggested:  >8%   Vitamin D (25 hydroxy)     Status: Abnormal   Collection Time: 06/13/20  9:33 AM  Result Value Ref Range   VITD 20.42 (L) 30.00 - 100.00 ng/mL  B12     Status: None   Collection Time: 06/13/20  9:33 AM  Result Value Ref Range   Vitamin B-12 551 211 - 911 pg/mL  Urinalysis, Routine w reflex microscopic     Status: None   Collection Time: 06/13/20  9:34 AM  Result Value Ref Range   Color, Urine YELLOW YELLOW   APPearance CLEAR CLEAR   Specific Gravity, Urine 1.016 1.001 - 1.03   pH < OR = 5.0 5.0 - 8.0   Glucose, UA NEGATIVE NEGATIVE   Bilirubin Urine NEGATIVE NEGATIVE   Ketones, ur NEGATIVE NEGATIVE   Hgb urine dipstick NEGATIVE NEGATIVE   Protein, ur NEGATIVE NEGATIVE   Nitrite NEGATIVE NEGATIVE   Leukocytes,Ua NEGATIVE NEGATIVE   Objective  Body mass index is 40.45 kg/m. Wt Readings from Last 3 Encounters:  06/13/20 250 lb 9.6 oz (113.7 kg)  01/28/20 231 lb (104.8 kg)  12/28/19 232 lb (105.2 kg)   Temp Readings from Last 3 Encounters:  06/13/20 98 F (36.7 C) (Oral)  01/28/20 98.2 F (36.8 C) (Oral)  12/28/19 (!) 97 F (36.1 C) (Temporal)   BP  Readings from Last 3 Encounters:  06/13/20 122/84  01/28/20 (!) 129/91  12/28/19 130/82   Pulse Readings from Last 3 Encounters:  06/13/20 77  01/28/20 77  12/28/19 81    Physical Exam Vitals and nursing note reviewed.  Constitutional:      Appearance: Normal appearance. She is well-developed and well-groomed. She is morbidly obese.  HENT:     Head: Normocephalic and atraumatic.     Right Ear: There is impacted cerumen.     Left Ear: There is impacted cerumen.  Eyes:     Conjunctiva/sclera: Conjunctivae normal.     Pupils: Pupils are equal, round, and reactive to light.  Cardiovascular:     Rate and Rhythm: Normal rate and regular rhythm.     Heart sounds: Normal heart sounds. No murmur heard.   Pulmonary:     Effort: Pulmonary effort is normal.     Breath sounds: Normal breath sounds.  Abdominal:     General: Abdomen is flat. Bowel sounds are normal.  Skin:    General: Skin is warm and dry.  Neurological:     General: No focal deficit present.     Mental Status: She is alert and oriented to person, place, and time. Mental status is at baseline.     Gait: Gait normal.  Psychiatric:        Attention and Perception: Attention and perception normal.        Mood and Affect: Mood and affect normal.        Speech: Speech normal.        Behavior: Behavior normal. Behavior is cooperative.  Thought Content: Thought content normal.        Cognition and Memory: Cognition and memory normal.        Judgment: Judgment normal.     Assessment  Plan  Type 2 diabetes mellitus without complication, without long-term current use of insulin (HCC) A1C 6.9  rec healthy diet and exercise  Will disc meds at f/u if not trending down   Chronic pain of both shoulders - Plan: Ambulatory referral to Orthopedic Surgery  Chronic midline low back pain without sciatica - Plan: Ambulatory referral to Orthopedic Surgery  Brain cyst - Plan: MR Brain W Wo Contrast  Chronic intractable  headache, unspecified headache type - Plan: MR Brain W Wo Contrast S/P brain surgery - Plan: MR Brain W Wo Contrast Consider neurology f/u   Multinodular goiter - Plan: US THYROID, TSH  Elevated blood pressure reading - Plan: Comprehensive metabolic panel, Lipid panel, CBC with Differential/Platelet monitor  Hyperglycemia - Plan: Hemoglobin A1c  Vitamin D deficiency - Plan: Vitamin D (25 hydroxy)  B12 deficiency - Plan: B12  Intractable migraine with status migrainosus, unspecified migraine type - Plan: butorphanol (STADOL) 10 MG/ML nasal spray  Grief reaction - Plan: ALPRAZolam (XANAX) 0.25 MG tablet  Insomnia, unspecified type - Plan: ALPRAZolam (XANAX) 0.25 MG tablet  Anxiety - Plan: ALPRAZolam (XANAX) 0.25 MG tablet  Blood in stool - Plan: pantoprazole (PROTONIX) 40 MG tablet  Gastritis without bleeding, unspecified chronicity, unspecified gastritis type - Plan: pantoprazole (PROTONIX) 40 MG tablet  Onychomycosis - Plan: terbinafine (LAMISIL) 250 MG tablet  Right carpal tunnel syndrome - Plan: meloxicam (MOBIC) 15 MG tablet F/u ortho may need EMG/NCS  Umbilical hernia without obstruction and without gangrene monitor  Morbid obesity with BMI of 40.0-44.9, adult (HCC) rec healthy diet and exercis e  Bilateral impacted cerumen Consented and tolerated b/l cerumen removal with currette all wax removed b/l   Hoarseness of voice  Consider ent est vs pulm in future  HM Declines flu shot  Tdap per pt had in 2014 declinespna 23, shingrix/covid 19 vaccine  Mammogram11/5/20 normalordered Colonoscopy2-19-20 hyperplastic polyps f/u 10 years -flex sig 05/25/19 and colonoscopy 11/16/18  Pap get [place] [facility] signed release todayreceived no record -pap 12/07/19 negative   Smoker rec smoking cessation on and off since age 49 max 1 ppd now 1ppd FH lung cancer -as of 09/07/19 denies smoking cigs  US thyroid 11/23/18 left lower nodules f/u 1  yearpt needs to call ENT for f/u and consider Dr. Harlow Asa in future to remove thyroid vs ENT  -10/04/19 thyroid USstable thyroid nodule f/u in 1 year ordered   Eye Dr. Gloriann Loan  Provider: Dr. Olivia Mackie McLean-Scocuzza-Internal Medicine

## 2020-06-13 NOTE — Telephone Encounter (Signed)
I also need to sch pt [place] Thanks

## 2020-06-14 LAB — URINALYSIS, ROUTINE W REFLEX MICROSCOPIC
Bilirubin Urine: NEGATIVE
Glucose, UA: NEGATIVE
Hgb urine dipstick: NEGATIVE
Ketones, ur: NEGATIVE
Leukocytes,Ua: NEGATIVE
Nitrite: NEGATIVE
Protein, ur: NEGATIVE
Specific Gravity, Urine: 1.016 (ref 1.001–1.03)
pH: <=5 (ref 5.0–8.0)

## 2020-06-17 ENCOUNTER — Other Ambulatory Visit: Payer: Self-pay | Admitting: Internal Medicine

## 2020-06-17 ENCOUNTER — Encounter: Payer: Self-pay | Admitting: Internal Medicine

## 2020-06-17 DIAGNOSIS — E559 Vitamin D deficiency, unspecified: Secondary | ICD-10-CM

## 2020-06-17 DIAGNOSIS — E119 Type 2 diabetes mellitus without complications: Secondary | ICD-10-CM

## 2020-06-17 DIAGNOSIS — G43911 Migraine, unspecified, intractable, with status migrainosus: Secondary | ICD-10-CM

## 2020-06-17 HISTORY — DX: Type 2 diabetes mellitus without complications: E11.9

## 2020-06-17 MED ORDER — CHOLECALCIFEROL 1.25 MG (50000 UT) PO CAPS: 50000.0000 [IU] | ORAL_CAPSULE | ORAL | 3 refills | Status: DC

## 2020-06-18 DIAGNOSIS — F419 Anxiety disorder, unspecified: Secondary | ICD-10-CM | POA: Insufficient documentation

## 2020-06-18 DIAGNOSIS — G4700 Insomnia, unspecified: Secondary | ICD-10-CM | POA: Insufficient documentation

## 2020-06-18 DIAGNOSIS — Z9889 Other specified postprocedural states: Secondary | ICD-10-CM | POA: Insufficient documentation

## 2020-06-18 DIAGNOSIS — M25512 Pain in left shoulder: Secondary | ICD-10-CM | POA: Insufficient documentation

## 2020-06-18 DIAGNOSIS — Z6841 Body Mass Index (BMI) 40.0 and over, adult: Secondary | ICD-10-CM | POA: Insufficient documentation

## 2020-06-18 DIAGNOSIS — G8929 Other chronic pain: Secondary | ICD-10-CM | POA: Insufficient documentation

## 2020-06-18 MED ORDER — BUTORPHANOL TARTRATE 10 MG/ML NA SOLN: 1.0000 | Freq: Once | NASAL | 2 refills | Status: DC | PRN

## 2020-06-18 NOTE — Progress Notes (Signed)
Patient informed and verbalized understanding.   Patient is agreeable to medication, will schedule eye exam.

## 2020-07-04 ENCOUNTER — Encounter: Payer: Self-pay | Admitting: Internal Medicine

## 2020-07-22 ENCOUNTER — Telehealth: Payer: Self-pay | Admitting: Internal Medicine

## 2020-07-22 NOTE — Telephone Encounter (Signed)
noted 

## 2020-07-22 NOTE — Telephone Encounter (Signed)
Rejection Reason - Patient was No Show - Patient was a no call no show to her scheduled appointment on Jul 22, 2020. This patient has not contacted [place] back to schedule. Referral is being closed due to time sensitivity but pt has our info to call and schedule. We will still be happy to assist your office and the patient. Thank you! " Rosanne Gutting, [place] said on Jul 22, 2020 11:21 AM

## 2020-09-12 ENCOUNTER — Other Ambulatory Visit: Payer: Self-pay

## 2020-09-12 ENCOUNTER — Other Ambulatory Visit (INDEPENDENT_AMBULATORY_CARE_PROVIDER_SITE_OTHER): Payer: Worker's Compensation

## 2020-09-12 DIAGNOSIS — E119 Type 2 diabetes mellitus without complications: Secondary | ICD-10-CM

## 2020-09-12 LAB — COMPREHENSIVE METABOLIC PANEL
ALT: 16 U/L (ref 0–35)
AST: 11 U/L (ref 0–37)
Albumin: 4.5 g/dL (ref 3.5–5.2)
Alkaline Phosphatase: 61 U/L (ref 39–117)
BUN: 16 mg/dL (ref 6–23)
CO2: 27 mEq/L (ref 19–32)
Calcium: 9.8 mg/dL (ref 8.4–10.5)
Chloride: 107 mEq/L (ref 96–112)
Creatinine, Ser: 0.88 mg/dL (ref 0.40–1.20)
GFR: 74.18 mL/min (ref >60.00)
Glucose, Bld: 112 mg/dL — ABNORMAL HIGH (ref 70–99)
Potassium: 4.5 mEq/L (ref 3.5–5.1)
Sodium: 140 mEq/L (ref 135–145)
Total Bilirubin: 0.4 mg/dL (ref 0.2–1.2)
Total Protein: 7.8 g/dL (ref 6.0–8.3)

## 2020-09-12 LAB — HEMOGLOBIN A1C: Hgb A1c MFr Bld: 6.9 % — ABNORMAL HIGH (ref 4.6–6.5)

## 2020-09-13 LAB — MICROALBUMIN / CREATININE URINE RATIO
Creatinine, Urine: 136 mg/dL (ref 20–275)
Microalb Creat Ratio: 7 mcg/mg creat (ref <30)
Microalb, Ur: 1 mg/dL

## 2020-09-16 ENCOUNTER — Other Ambulatory Visit: Payer: Self-pay | Admitting: Internal Medicine

## 2020-09-16 DIAGNOSIS — E119 Type 2 diabetes mellitus without complications: Secondary | ICD-10-CM

## 2020-09-16 MED ORDER — METFORMIN HCL ER 500 MG PO TB24: 500.0000 mg | ORAL_TABLET | Freq: Every day | ORAL | 3 refills | Status: DC

## 2020-10-01 ENCOUNTER — Emergency Department: Payer: HRSA Program

## 2020-10-01 ENCOUNTER — Other Ambulatory Visit: Payer: Self-pay

## 2020-10-01 ENCOUNTER — Emergency Department
Admission: EM | Admit: 2020-10-01 | Discharge: 2020-10-01 | Disposition: A | Discharge status: Home / Self Care | Payer: HRSA Program | Attending: Emergency Medicine | Admitting: Emergency Medicine

## 2020-10-01 DIAGNOSIS — E119 Type 2 diabetes mellitus without complications: Secondary | ICD-10-CM | POA: Diagnosis not present

## 2020-10-01 DIAGNOSIS — U071 COVID-19: Secondary | ICD-10-CM | POA: Diagnosis not present

## 2020-10-01 DIAGNOSIS — M791 Myalgia, unspecified site: Secondary | ICD-10-CM

## 2020-10-01 DIAGNOSIS — Z87891 Personal history of nicotine dependence: Secondary | ICD-10-CM | POA: Insufficient documentation

## 2020-10-01 DIAGNOSIS — Z7984 Long term (current) use of oral hypoglycemic drugs: Secondary | ICD-10-CM | POA: Diagnosis not present

## 2020-10-01 DIAGNOSIS — R0602 Shortness of breath: Secondary | ICD-10-CM | POA: Diagnosis present

## 2020-10-01 DIAGNOSIS — R110 Nausea: Secondary | ICD-10-CM

## 2020-10-01 LAB — CBC WITH DIFFERENTIAL/PLATELET
Abs Immature Granulocytes: 0.02 10*3/uL (ref 0.00–0.07)
Basophils Absolute: 0 10*3/uL (ref 0.0–0.1)
Basophils Relative: 1 %
Eosinophils Absolute: 0 10*3/uL (ref 0.0–0.5)
Eosinophils Relative: 1 %
HCT: 39.6 % (ref 36.0–46.0)
Hemoglobin: 12.7 g/dL (ref 12.0–15.0)
Immature Granulocytes: 0 %
Lymphocytes Relative: 55 %
Lymphs Abs: 4.7 10*3/uL — ABNORMAL HIGH (ref 0.7–4.0)
MCH: 26.5 pg (ref 26.0–34.0)
MCHC: 32.1 g/dL (ref 30.0–36.0)
MCV: 82.5 fL (ref 80.0–100.0)
Monocytes Absolute: 0.9 10*3/uL (ref 0.1–1.0)
Monocytes Relative: 10 %
Neutro Abs: 2.8 10*3/uL (ref 1.7–7.7)
Neutrophils Relative %: 33 %
Platelets: 266 10*3/uL (ref 150–400)
RBC: 4.8 MIL/uL (ref 3.87–5.11)
RDW: 15.9 % — ABNORMAL HIGH (ref 11.5–15.5)
WBC: 8.5 10*3/uL (ref 4.0–10.5)
nRBC: 0 % (ref 0.0–0.2)

## 2020-10-01 LAB — URINALYSIS, COMPLETE (UACMP) WITH MICROSCOPIC
Bilirubin Urine: NEGATIVE
Glucose, UA: NEGATIVE mg/dL
Ketones, ur: NEGATIVE mg/dL
Leukocytes,Ua: NEGATIVE
Nitrite: POSITIVE — AB
Protein, ur: NEGATIVE mg/dL
Specific Gravity, Urine: 1.019 (ref 1.005–1.030)
pH: 5 (ref 5.0–8.0)

## 2020-10-01 LAB — COMPREHENSIVE METABOLIC PANEL
ALT: 14 U/L (ref 0–44)
AST: 18 U/L (ref 15–41)
Albumin: 4 g/dL (ref 3.5–5.0)
Alkaline Phosphatase: 59 U/L (ref 38–126)
Anion gap: 11 (ref 5–15)
BUN: 11 mg/dL (ref 6–20)
CO2: 20 mmol/L — ABNORMAL LOW (ref 22–32)
Calcium: 8.8 mg/dL — ABNORMAL LOW (ref 8.9–10.3)
Chloride: 107 mmol/L (ref 98–111)
Creatinine, Ser: 0.95 mg/dL (ref 0.44–1.00)
GFR, Estimated: >60 mL/min (ref >60)
Glucose, Bld: 172 mg/dL — ABNORMAL HIGH (ref 70–99)
Potassium: 3.6 mmol/L (ref 3.5–5.1)
Sodium: 138 mmol/L (ref 135–145)
Total Bilirubin: 0.4 mg/dL (ref 0.3–1.2)
Total Protein: 8.1 g/dL (ref 6.5–8.1)

## 2020-10-01 LAB — LACTIC ACID, PLASMA: Lactic Acid, Venous: 1 mmol/L (ref 0.5–1.9)

## 2020-10-01 LAB — TROPONIN I (HIGH SENSITIVITY): Troponin I (High Sensitivity): 9 ng/L (ref <18)

## 2020-10-01 MED ORDER — ALBUTEROL SULFATE HFA 108 (90 BASE) MCG/ACT IN AERS
2.0000 | INHALATION_SPRAY | Freq: Once | RESPIRATORY_TRACT | Status: DC
  Filled 2020-10-01: qty 6.7

## 2020-10-01 MED ORDER — ALBUTEROL SULFATE HFA 108 (90 BASE) MCG/ACT IN AERS: 2.0000 | INHALATION_SPRAY | Freq: Four times a day (QID) | RESPIRATORY_TRACT | 0 refills | Status: DC | PRN

## 2020-10-01 MED ORDER — KETOROLAC TROMETHAMINE 30 MG/ML IJ SOLN
15.0000 mg | Freq: Once | INTRAMUSCULAR | Status: AC
  Administered 2020-10-01: 15 mg via INTRAVENOUS
  Filled 2020-10-01: qty 1

## 2020-10-01 MED ORDER — ONDANSETRON 4 MG PO TBDP: 4.0000 mg | ORAL_TABLET | Freq: Three times a day (TID) | ORAL | 0 refills | Status: DC | PRN

## 2020-10-01 MED ORDER — LACTATED RINGERS IV BOLUS
1000.0000 mL | Freq: Once | INTRAVENOUS | Status: AC
  Administered 2020-10-01: 1000 mL via INTRAVENOUS

## 2020-10-01 MED ORDER — ONDANSETRON HCL 4 MG/2ML IJ SOLN
4.0000 mg | Freq: Once | INTRAMUSCULAR | Status: AC
  Administered 2020-10-01: 4 mg via INTRAVENOUS
  Filled 2020-10-01: qty 2

## 2020-10-01 NOTE — ED Triage Notes (Signed)
Pt Covid+, in w/sob RA sats 92%. States she feels incr wkns, body aches, unable to keep fluids down. Cough productive, temp 99.8 in triage

## 2020-10-01 NOTE — ED Notes (Signed)
Ambulatory pulse ox readings performed per EDP verbal order. Pt walked back and forth at bedside for 3 minutes. SPO2 remained 98-100% on RA.

## 2020-10-01 NOTE — ED Provider Notes (Signed)
[facility] Emergency Department Provider Note   ____________________________________________   Event Date/Time   First MD Initiated Contact with Patient 10/01/20 1325     (approximate)  I have reviewed the triage vital signs and the nursing notes.   HISTORY  Chief Complaint Covid Positive, Shortness of Breath, and Weakness    HPI Carmen Cooley is a 55 y.o. female with past medical history of diabetes, migraines, and seizures who presents to the ED complaining of shortness of breath and weakness.  Patient reports that she has been feeling generally weak with body aches, headaches, cough, and difficulty breathing for the past couple of days.  She tested positive for COVID-19 yesterday and is unvaccinated.  She denies any fevers, chest pain, vomiting, or diarrhea.  Her primary concern at the moment is generalized weakness and diffuse body aches, she describes minimal difficulty breathing while at rest.        Past Medical History:  Diagnosis Date  . Allergy   . DM type 2 (diabetes mellitus, type 2) ([place]) 06/17/2020  . History of blood transfusion   . Migraines   . Seizure ([place])   . UTI (urinary tract infection)     Patient Active Problem List   Diagnosis Date Noted  . Insomnia 06/18/2020  . Morbid obesity with BMI of 40.0-44.9, adult ([place]) 06/18/2020  . Chronic pain of both shoulders 06/18/2020  . S/P brain surgery 06/18/2020  . Anxiety 06/18/2020  . Type 2 diabetes mellitus without complication, without long-term current use of insulin ([place]) 06/17/2020  . Multinodular goiter 06/13/2020  . Umbilical hernia without obstruction and without gangrene 06/13/2020  . Cervical polyp 12/07/2019  . Intertrigo 12/07/2019  . Onychomycosis 10/12/2019  . Annual physical exam 09/07/2019  . Leukocytosis 09/07/2019  . Tinea pedis of both feet 09/07/2019  . Hyperlipidemia 11/30/2018  . Thyroid nodule 11/30/2018  . Iron deficiency 11/30/2018  . Vitamin D  deficiency 11/03/2018  . Upper back pain on left side 11/02/2018  . Goiter 11/02/2018  . Cough 11/02/2018  . Chronic left shoulder pain 11/02/2018  . Tobacco abuse 11/02/2018    Past Surgical History:  Procedure Laterality Date  . 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 CESAREAN SECTION     x 1  . COLONOSCOPY WITH PROPOFOL N/A 11/16/2018   Procedure: COLONOSCOPY WITH PROPOFOL;  Surgeon: Jonathon Bellows, MD;  Location: [facility] ENDOSCOPY;  Service: Gastroenterology;  Laterality: N/A;  . FLEXIBLE SIGMOIDOSCOPY N/A 05/25/2019   Procedure: FLEXIBLE SIGMOIDOSCOPY;  Surgeon: Jonathon Bellows, MD;  Location: [facility] ENDOSCOPY;  Service: Gastroenterology;  Laterality: N/A;  . FOOT SURGERY    . KNEE SURGERY      Prior to Admission medications   Medication Sig Start Date End Date Taking? Authorizing Provider  albuterol (VENTOLIN HFA) 108 (90 Base) MCG/ACT inhaler Inhale 2 puffs into the lungs every 6 (six) hours as needed for wheezing or shortness of breath. 10/01/20  Yes Blake Divine, MD  ondansetron (ZOFRAN ODT) 4 MG disintegrating tablet Take 1 tablet (4 mg total) by mouth every 8 (eight) hours as needed for nausea or vomiting. 10/01/20  Yes Blake Divine, MD  ALPRAZolam Duanne Moron) 0.25 MG tablet Take 1 tablet (0.25 mg total) by mouth at bedtime as needed for anxiety. 06/13/20   McLean-Scocuzza, Nino Glow, MD  ammonium lactate (AMLACTIN) 12 % lotion Apply 1 application topically as needed for dry skin. 09/12/19   Felipa Furnace, DPM  BIOTIN 5000  PO Take by mouth.    [provider]  butorphanol (STADOL) 10 MG/ML nasal spray Place 1 spray into the nose once as needed for up to 1 dose for headache. May repeat in 60-90 min then Q4-6 hrs x 2-4 days 06/18/20   McLean-Scocuzza, Pasty Spillers, MD  Cholecalciferol (VITAMIN D3) 2400 UNIT/ML LIQD by Does not apply route daily. Patient takes 15 drops under tongue daily.    [provider]  Cholecalciferol 1.25 MG (50000 UT) capsule Take 1  capsule (50,000 Units total) by mouth once a week. D3 generic ok 06/17/20   McLean-Scocuzza, Pasty Spillers, MD  hydrocortisone 2.5 % cream Apply topically 2 (two) times daily. Prn right breast 10/12/19   McLean-Scocuzza, Pasty Spillers, MD  meloxicam (MOBIC) 15 MG tablet Take 15 mg by mouth daily. 02/21/20   [provider]  metFORMIN (GLUCOPHAGE XR) 500 MG 24 hr tablet Take 1 tablet (500 mg total) by mouth daily with breakfast. 09/16/20   McLean-Scocuzza, Pasty Spillers, MD  miconazole (MICATIN) 2 % cream Apply 1 application topically 2 (two) times daily. 10/12/19   McLean-Scocuzza, Pasty Spillers, MD  Multiple Vitamin (MULTIVITAMIN) tablet Take 1 tablet by mouth daily.    [provider]  pantoprazole (PROTONIX) 40 MG tablet Take 1 tablet (40 mg total) by mouth daily. 30 minutes before food 06/13/20   McLean-Scocuzza, Pasty Spillers, MD  terbinafine (LAMISIL) 250 MG tablet Take 1 tablet (250 mg total) by mouth daily. 06/13/20   McLean-Scocuzza, Pasty Spillers, MD    Allergies Compazine [prochlorperazine] and Contrast media [iodinated diagnostic agents]  Family History  Problem Relation Age of Onset  . Diabetes Father   . Cancer Father        sinus   . Hypertension Father   . Cancer Other   . Cancer Mother        pancreatic/whipple/chemo  . Hypertension Mother   . Cancer Maternal Grandmother        lung smoker  . Cancer Paternal Grandmother        elbow per pt     Social History Social History   Tobacco Use  . Smoking status: Former Smoker    Packs/day: 0.50    Years: 33.00    Pack years: 16.50    Quit date: 09/28/2018    Years since quitting: 2.0  . Smokeless tobacco: Never Used  Vaping Use  . Vaping Use: Never used  Substance Use Topics  . Alcohol use: Not Currently    Comment: occasional  . Drug use: No    Review of Systems  Constitutional: No fever/chills.  Positive for generalized weakness and fatigue. Eyes: No visual changes. ENT: No sore throat. Cardiovascular: Denies chest  pain. Respiratory: Positive for cough and shortness of breath. Gastrointestinal: No abdominal pain.  No nausea, no vomiting.  No diarrhea.  No constipation. Genitourinary: Negative for dysuria. Musculoskeletal: Negative for back pain.  Positive for myalgias. Skin: Negative for rash. Neurological: Negative for headaches, focal weakness or numbness.  ____________________________________________   PHYSICAL EXAM:  VITAL SIGNS: ED Triage Vitals [10/01/20 1016]  Enc Vitals Group     BP 121/75     Pulse Rate 89     Resp 20     Temp 99.8 F (37.7 C)     Temp Source Oral     SpO2 92 %     Weight      Height      Head Circumference      Peak Flow  Pain Score      Pain Loc      Pain Edu?      Excl. in [place]?     Constitutional: Alert and oriented. Eyes: Conjunctivae are normal. Head: Atraumatic. Nose: No congestion/rhinnorhea. Mouth/Throat: Mucous membranes are moist. Neck: Normal ROM Cardiovascular: Normal rate, regular rhythm. Grossly normal heart sounds.  2+ radial pulses bilaterally. Respiratory: Normal respiratory effort.  No retractions. Lungs CTAB. Gastrointestinal: Soft and nontender. No distention. Genitourinary: deferred Musculoskeletal: No lower extremity tenderness nor edema. Neurologic:  Normal speech and language. No gross focal neurologic deficits are appreciated. Skin:  Skin is warm, dry and intact. No rash noted. Psychiatric: Mood and affect are normal. Speech and behavior are normal.  ____________________________________________   LABS (all labs ordered are listed, but only abnormal results are displayed)  Labs Reviewed  COMPREHENSIVE METABOLIC PANEL - Abnormal; Notable for the following components:      Result Value   CO2 20 (*)    Glucose, Bld 172 (*)    Calcium 8.8 (*)    All other components within normal limits  CBC WITH DIFFERENTIAL/PLATELET - Abnormal; Notable for the following components:   RDW 15.9 (*)    Lymphs Abs 4.7 (*)    All other  components within normal limits  URINALYSIS, COMPLETE (UACMP) WITH MICROSCOPIC - Abnormal; Notable for the following components:   Color, Urine YELLOW (*)    APPearance CLOUDY (*)    Hgb urine dipstick SMALL (*)    Nitrite POSITIVE (*)    Bacteria, UA RARE (*)    All other components within normal limits  URINE CULTURE  LACTIC ACID, PLASMA  TROPONIN I (HIGH SENSITIVITY)   ____________________________________________  EKG  ED ECG REPORT I, Blake Divine, the attending physician, personally viewed and interpreted this ECG.   Date: 10/01/2020  EKG Time: 14:31  Rate: 72  Rhythm: normal sinus rhythm  Axis: Normal  Intervals:none  ST&T Change: None   PROCEDURES  Procedure(s) performed (including Critical Care):  Procedures   ____________________________________________   INITIAL IMPRESSION / ASSESSMENT AND PLAN / ED COURSE       55 year old female with past medical history of diabetes, migraines, and seizures who presents to the ED complaining of generalized weakness, body aches, cough, and shortness of breath after testing positive for COVID-19 yesterday.  She is not in any respiratory distress and maintaining O2 sat of 92 to 93% on room air.  We will check ambulatory pulse ox.  Chest x-ray reviewed by me and shows no infiltrate, edema, or effusion.  Other vital signs are also reassuring.  Labs are unremarkable, no AKI or electrolyte abnormality.  We will check EKG and troponin, treat patient symptomatically with IV fluids, Toradol, and Zofran.  Patient able to ambulate on pulse ox with no drop in sats or significant difficulty breathing.  She reports feeling better following fluids, Toradol, and Zofran.  She is appropriate for discharge home with PCP follow-up, we will prescribe albuterol and Zofran.  She was counseled to return to the ED for new worsening symptoms, patient agrees with plan.      ____________________________________________   FINAL CLINICAL  IMPRESSION(S) / ED DIAGNOSES  Final diagnoses:  COVID-19  Nausea  Myalgia     ED Discharge Orders         Ordered    ondansetron (ZOFRAN ODT) 4 MG disintegrating tablet  Every 8 hours PRN        10/01/20 1455    albuterol (VENTOLIN HFA) 108 (90 Base)  MCG/ACT inhaler  Every 6 hours PRN       Note to Pharmacy: Please supply with spacer   10/01/20 1455           Note:  This document was prepared using Dragon voice recognition software and may include unintentional dictation errors.   Blake Divine, MD 10/01/20 1501

## 2020-10-03 ENCOUNTER — Encounter: Payer: Self-pay | Admitting: Internal Medicine

## 2020-10-03 ENCOUNTER — Emergency Department: Admission: EM | Admit: 2020-10-03 | Discharge: 2020-10-03 | Discharge status: Absent without leave | Payer: Self-pay

## 2020-10-03 LAB — URINE CULTURE: Culture: >=100000 — AB

## 2020-10-04 ENCOUNTER — Encounter: Payer: Self-pay | Admitting: Internal Medicine

## 2020-10-04 ENCOUNTER — Telehealth (INDEPENDENT_AMBULATORY_CARE_PROVIDER_SITE_OTHER): Payer: HRSA Program | Admitting: Internal Medicine

## 2020-10-04 ENCOUNTER — Other Ambulatory Visit: Payer: Self-pay

## 2020-10-04 VITALS — Temp 98.8°F | Ht 66.0 in | Wt 234.0 lb

## 2020-10-04 DIAGNOSIS — B9620 Unspecified Escherichia coli [E. coli] as the cause of diseases classified elsewhere: Secondary | ICD-10-CM

## 2020-10-04 DIAGNOSIS — N390 Urinary tract infection, site not specified: Secondary | ICD-10-CM

## 2020-10-04 DIAGNOSIS — U071 COVID-19: Secondary | ICD-10-CM | POA: Diagnosis not present

## 2020-10-04 MED ORDER — CIPROFLOXACIN HCL 500 MG PO TABS: 500.0000 mg | ORAL_TABLET | Freq: Two times a day (BID) | ORAL | 0 refills | Status: DC

## 2020-10-04 MED ORDER — FLUTICASONE PROPIONATE 50 MCG/ACT NA SUSP: 2.0000 | Freq: Every day | NASAL | 2 refills | Status: DC | PRN

## 2020-10-04 MED ORDER — SALINE SPRAY 0.65 % NA SOLN: 2.0000 | NASAL | 0 refills | Status: DC | PRN

## 2020-10-04 MED ORDER — DM-GUAIFENESIN ER 60-1200 MG PO TB12
1.0000 | ORAL_TABLET | Freq: Two times a day (BID) | ORAL | 0 refills | Status: DC
Start: 2020-10-04 — End: 2020-12-24

## 2020-10-04 MED ORDER — DEXAMETHASONE 6 MG PO TABS: 6.0000 mg | ORAL_TABLET | Freq: Every day | ORAL | 0 refills | Status: DC

## 2020-10-04 NOTE — Telephone Encounter (Signed)
Patient called in stated that [facility] has cipro

## 2020-10-04 NOTE — Progress Notes (Signed)
Telephone Note  I connected with Carmen Cooley  on 10/04/20 at  9:15 AM EST by telephone and verified that I am speaking with the correct person using two identifiers.  Location patient: home, [place] Location provider:work or home office Persons participating in the virtual visit: patient, provider  I discussed the limitations of evaluation and management by telemedicine and the availability of in person appointments. The patient expressed understanding and agreed to proceed.   HPI: 1. E coli uti dx 10/03/20 with h/o in 05/2019 she is having flank pain 2. covid 50 + had dx + in the ER Tuesday with body aches given toradol last night had hives all over body + and took benadryl otc which helped with nasal congestion and cough and sore throat, sob improved  Daughter also + she had increased gas, tried laxative otc and helped but gave her diarrhea, having dry to wet cough nothing tried   ROS: See pertinent positives and negatives per HPI.  Past Medical History:  Diagnosis Date  . Allergy   . DM type 2 (diabetes mellitus, type 2) ([place]) 06/17/2020  . History of blood transfusion   . Migraines   . Seizure ([place])   . UTI (urinary tract infection)     Past Surgical History:  Procedure Laterality Date  . 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 CESAREAN SECTION     x 1  . COLONOSCOPY WITH PROPOFOL N/A 11/16/2018   Procedure: COLONOSCOPY WITH PROPOFOL;  Surgeon: Jonathon Bellows, MD;  Location: [facility] ENDOSCOPY;  Service: Gastroenterology;  Laterality: N/A;  . FLEXIBLE SIGMOIDOSCOPY N/A 05/25/2019   Procedure: FLEXIBLE SIGMOIDOSCOPY;  Surgeon: Jonathon Bellows, MD;  Location: [facility] ENDOSCOPY;  Service: Gastroenterology;  Laterality: N/A;  . FOOT SURGERY    . KNEE SURGERY       Current Outpatient Medications:  .  albuterol (VENTOLIN HFA) 108 (90 Base) MCG/ACT inhaler, Inhale 2 puffs into the lungs every 6 (six) hours as needed for wheezing or shortness of breath., Disp: 8 g, Rfl: 0 .   ALPRAZolam (XANAX) 0.25 MG tablet, Take 1 tablet (0.25 mg total) by mouth at bedtime as needed for anxiety., Disp: 30 tablet, Rfl: 0 .  ammonium lactate (AMLACTIN) 12 % lotion, Apply 1 application topically as needed for dry skin., Disp: 400 g, Rfl: 0 .  butorphanol (STADOL) 10 MG/ML nasal spray, Place 1 spray into the nose once as needed for up to 1 dose for headache. May repeat in 60-90 min then Q4-6 hrs x 2-4 days, Disp: 2.5 mL, Rfl: 2 .  cholecalciferol (VITAMIN D3) 25 MCG (1000 UNIT) tablet, Take 1,000 Units by mouth daily., Disp: , Rfl:  .  ciprofloxacin (CIPRO) 500 MG tablet, Take 1 tablet (500 mg total) by mouth 2 (two) times daily. With food, Disp: 10 tablet, Rfl: 0 .  dexamethasone (DECADRON) 6 MG tablet, Take 1 tablet (6 mg total) by mouth daily. With am, Disp: 5 tablet, Rfl: 0 .  Dextromethorphan-Guaifenesin 60-1200 MG 12hr tablet, Take 1 tablet by mouth every 12 (twelve) hours., Disp: 30 tablet, Rfl: 0 .  fluticasone (FLONASE) 50 MCG/ACT nasal spray, Place 2 sprays into both nostrils daily as needed for allergies or rhinitis., Disp: 16 g, Rfl: 2 .  hydrocortisone 2.5 % cream, Apply topically 2 (two) times daily. Prn right breast, Disp: 60 g, Rfl: 2 .  meloxicam (MOBIC) 15 MG tablet, Take 15 mg by mouth daily., Disp: , Rfl:  .  metFORMIN (  GLUCOPHAGE XR) 500 MG 24 hr tablet, Take 1 tablet (500 mg total) by mouth daily with breakfast., Disp: 90 tablet, Rfl: 3 .  miconazole (MICATIN) 2 % cream, Apply 1 application topically 2 (two) times daily., Disp: 60 g, Rfl: 11 .  Multiple Vitamin (MULTIVITAMIN) tablet, Take 1 tablet by mouth daily., Disp: , Rfl:  .  ondansetron (ZOFRAN ODT) 4 MG disintegrating tablet, Take 1 tablet (4 mg total) by mouth every 8 (eight) hours as needed for nausea or vomiting., Disp: 12 tablet, Rfl: 0 .  pantoprazole (PROTONIX) 40 MG tablet, Take 1 tablet (40 mg total) by mouth daily. 30 minutes before food, Disp: 90 tablet, Rfl: 3 .  sodium chloride (OCEAN) 0.65 % SOLN  nasal spray, Place 2 sprays into both nostrils as needed for congestion., Disp: 30 mL, Rfl: 0 .  terbinafine (LAMISIL) 250 MG tablet, Take 1 tablet (250 mg total) by mouth daily., Disp: 90 tablet, Rfl: 0  EXAM:  VITALS per patient if applicable:  GENERAL: alert, oriented, appears well and in no acute distress  PSYCH/NEURO: pleasant and cooperative, no obvious depression or anxiety, speech and thought processing grossly intact  ASSESSMENT AND PLAN:  Discussed the following assessment and plan:  E-coli UTI - Plan: ciprofloxacin (CIPRO) 500 MG tablet bid x 5 days  COVID-19 feeling better - Plan: fluticasone (FLONASE) 50 MCG/ACT nasal spray, sodium chloride (OCEAN) 0.65 % SOLN nasal spray, dexamethasone (DECADRON) 6 MG tablet, Dextromethorphan-Guaifenesin 60-1200 MG 12hr tablet There is no medication other than over the counter meds: Mucinex dm green label for cough. Vitamin C 1000 mg daily. Vitamin D3 4000 Iu (units) daily. Zinc 100 mg daily. Quercetin 250-500 mg 2 times per day  Elderberry syrup  Oil of oregano  Monitor pulse ox, buy from [place] if oxygen is less than 90 please go to the hospital.     Are you feeling really sick? Shortness of breath, cough, chest pain? If so let me know  If worsening, go to hospital.  -we discussed possible serious and likely etiologies, options for evaluation and workup, limitations of telemedicine visit vs in person visit, treatment, treatment risks and precautions.     I discussed the assessment and treatment plan with the patient. The patient was provided an opportunity to ask questions and all were answered. The patient agreed with the plan and demonstrated an understanding of the instructions.    Time spent 20 min Delorise Jackson, MD

## 2020-10-04 NOTE — Patient Instructions (Signed)
There is no medication other than over the counter meds: Mucinex dm green label for cough. Vitamin C 1000 mg daily. Vitamin D3 4000 Iu (units) daily. Zinc 100 mg daily. Quercetin 250-500 mg 2 times per day  Elderberry syrup  Oil of oregano   Monitor pulse oximeter, buy from [place] if oxygen is less than 90 please go to the hospital.     Are you feeling really sick? Shortness of breath, cough, chest pain, dizzy, confused? If so let me know  If worsening, go to hospital.

## 2020-12-11 ENCOUNTER — Ambulatory Visit: Payer: Self-pay | Admitting: Internal Medicine

## 2020-12-12 ENCOUNTER — Ambulatory Visit (INDEPENDENT_AMBULATORY_CARE_PROVIDER_SITE_OTHER): Payer: Self-pay | Admitting: Internal Medicine

## 2020-12-12 ENCOUNTER — Encounter: Payer: Self-pay | Admitting: Internal Medicine

## 2020-12-12 ENCOUNTER — Other Ambulatory Visit: Payer: Self-pay

## 2020-12-12 VITALS — BP 124/78 | HR 90 | Temp 97.9°F | Ht 66.0 in | Wt 244.0 lb

## 2020-12-12 DIAGNOSIS — R202 Paresthesia of skin: Secondary | ICD-10-CM

## 2020-12-12 DIAGNOSIS — Z0000 Encounter for general adult medical examination without abnormal findings: Secondary | ICD-10-CM

## 2020-12-12 DIAGNOSIS — Z1329 Encounter for screening for other suspected endocrine disorder: Secondary | ICD-10-CM

## 2020-12-12 DIAGNOSIS — B351 Tinea unguium: Secondary | ICD-10-CM

## 2020-12-12 DIAGNOSIS — E119 Type 2 diabetes mellitus without complications: Secondary | ICD-10-CM

## 2020-12-12 DIAGNOSIS — E559 Vitamin D deficiency, unspecified: Secondary | ICD-10-CM

## 2020-12-12 MED ORDER — TERBINAFINE HCL 250 MG PO TABS: 250.0000 mg | ORAL_TABLET | Freq: Every day | ORAL | 0 refills | Status: DC

## 2020-12-12 NOTE — Progress Notes (Signed)
Chief Complaint  Patient presents with  . Annual Exam   Annual  1. Onychomycosis wants refill of lamisil tired previously an dhelped  2. C/o numbness/tingling upper arms ?CTS/parasthesia consider NCS neurology in future she will let me know currently w/o insurance  3. She is going to [place] to get dental implants she also is thinking about cosmetic procedures 4. DM 2 A1C 09/12/20 6.9 metformin 500 mg xr qd  5. Headache, chronic, new features or increased frequency; brain cyst hx s/p removal in 1982 with h/a x months and increased freq Dx: Brain cyst (G93.0 (ICD-10-CM)); Chronic intractable headache, unspecified headache type (R51.9, G89.29 (ICD-10-CM)); S/P brain surgery (Z98.890 (ICD-10-CM)) 6. US thyroid due   Above will hold on MRI and US thyroid due to no insurance   Review of Systems  Constitutional: Negative for weight loss.  HENT: Negative for hearing loss.   Eyes: Negative for blurred vision.  Respiratory: Negative for shortness of breath.   Cardiovascular: Negative for chest pain.  Gastrointestinal: Negative for abdominal pain.  Musculoskeletal: Negative for falls and joint pain.  Skin: Negative for rash.  Neurological: Negative for headaches.  Psychiatric/Behavioral: Negative for depression. The patient is not nervous/anxious.    Past Medical History:  Diagnosis Date  . Allergy   . Brain tumor ([place])    benign  . COVID-19    10/01/20  . DM type 2 (diabetes mellitus, type 2) ([place]) 06/17/2020  . History of blood transfusion   . Migraines   . Seizure ([place])   . UTI (urinary tract infection)    Past Surgical History:  Procedure Laterality Date  . 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 CESAREAN SECTION     x 1  . COLONOSCOPY WITH PROPOFOL N/A 11/16/2018   Procedure: COLONOSCOPY WITH PROPOFOL;  Surgeon: Jonathon Bellows, MD;  Location: [facility] ENDOSCOPY;  Service: Gastroenterology;  Laterality: N/A;  . FLEXIBLE SIGMOIDOSCOPY N/A 05/25/2019   Procedure:  FLEXIBLE SIGMOIDOSCOPY;  Surgeon: Jonathon Bellows, MD;  Location: [facility] ENDOSCOPY;  Service: Gastroenterology;  Laterality: N/A;  . FOOT SURGERY    . KNEE SURGERY     Family History  Problem Relation Age of Onset  . Diabetes Father   . Cancer Father        sinus   . Hypertension Father   . Cancer Other   . Cancer Mother        pancreatic/whipple/chemo  . Hypertension Mother   . Cancer Maternal Grandmother        lung smoker  . Cancer Paternal Grandmother        elbow per pt    Social History   Socioeconomic History  . Marital status: Single    Spouse name: Not on file  . Number of children: Not on file  . Years of education: Not on file  . Highest education level: Not on file  Occupational History  . Not on file  Tobacco Use  . Smoking status: Former Smoker    Packs/day: 0.50    Years: 33.00    Pack years: 16.50    Quit date: 09/28/2018    Years since quitting: 2.2  . Smokeless tobacco: Never Used  Vaping Use  . Vaping Use: Never used  Substance and Sexual Activity  . Alcohol use: Not Currently    Comment: occasional  . Drug use: No  . Sexual activity: Not Currently  Other Topics Concern  . 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 butorphanol (STADOL) 10 MG/ML nasal spray Place 1 spray into the nose once as needed for up to 1 dose for headache. May repeat in 60-90 min then Q4-6 hrs x 2-4 days  . cholecalciferol (VITAMIN D3) 25 MCG (1000 UNIT) tablet Take 1,000 Units by mouth daily.  . metFORMIN (GLUCOPHAGE XR) 500  MG 24 hr tablet Take 1 tablet (500 mg total) by mouth daily with breakfast.  . Multiple Vitamin (MULTIVITAMIN) tablet Take 1 tablet by mouth daily.   Allergies  Allergen Reactions  . Compazine [Prochlorperazine] Shortness Of Breath  . Contrast Media [Iodinated Diagnostic Agents] Hives   Recent Results (from the past 2160 hour(s))  Lactic acid, plasma     Status: None   Collection Time: 10/01/20 10:18 AM  Result Value Ref Range   Lactic Acid, Venous 1.0 0.5 - 1.9 mmol/L    Comment: Performed at [facility], [address] [place], [place] [address]  Comprehensive metabolic panel     Status: Abnormal   Collection Time: 10/01/20 10:18 AM  Result Value Ref Range   Sodium 138 135 - 145 mmol/L   Potassium 3.6 3.5 - 5.1 mmol/L   Chloride 107 98 - 111 mmol/L   CO2 20 (L) 22 - 32 mmol/L   Glucose, Bld 172 (H) 70 - 99 mg/dL    Comment: Glucose reference range applies only to samples taken after fasting for at least 8 hours.   BUN 11 6 - 20 mg/dL   Creatinine, Ser 0.95 0.44 - 1.00 mg/dL   Calcium 8.8 (L) 8.9 - 10.3 mg/dL   Total Protein 8.1 6.5 - 8.1 g/dL   Albumin 4.0 3.5 - 5.0 g/dL   AST 18 15 - 41 U/L   ALT 14 0 - 44 U/L   Alkaline Phosphatase 59 38 - 126 U/L   Total Bilirubin 0.4 0.3 - 1.2 mg/dL   GFR, Estimated >60 >60 mL/min    Comment: (NOTE) Calculated using the CKD-EPI Creatinine Equation (2021)    Anion gap 11 5 - 15    Comment: Performed at [facility], [address] [place], [place] [address]  CBC with Differential     Status: Abnormal   Collection Time: 10/01/20 10:18 AM  Result Value Ref Range   WBC 8.5 4.0 - 10.5 K/uL   RBC 4.80 3.87 - 5.11 MIL/uL   Hemoglobin 12.7 12.0 - 15.0 g/dL   HCT 39.6 36.0 - 46.0 %   MCV 82.5 80.0 - 100.0 fL   MCH 26.5 26.0 - 34.0 pg   MCHC 32.1 30.0 - 36.0 g/dL   RDW 15.9 (H) 11.5 - 15.5 %   Platelets 266 150 - 400 K/uL   nRBC 0.0 0.0 - 0.2 %   Neutrophils Relative % 33 %   Neutro Abs 2.8 1.7 - 7.7 K/uL    Lymphocytes Relative 55 %   Lymphs Abs 4.7 (  H) 0.7 - 4.0 K/uL   Monocytes Relative 10 %   Monocytes Absolute 0.9 0.1 - 1.0 K/uL   Eosinophils Relative 1 %   Eosinophils Absolute 0.0 0.0 - 0.5 K/uL   Basophils Relative 1 %   Basophils Absolute 0.0 0.0 - 0.1 K/uL   Immature Granulocytes 0 %   Abs Immature Granulocytes 0.02 0.00 - 0.07 K/uL    Comment: Performed at [facility], [address] [place], [place] [address]  Urinalysis, Complete w Microscopic     Status: Abnormal   Collection Time: 10/01/20 10:18 AM  Result Value Ref Range   Color, Urine YELLOW (A) YELLOW   APPearance CLOUDY (A) CLEAR   Specific Gravity, Urine 1.019 1.005 - 1.030   pH 5.0 5.0 - 8.0   Glucose, UA NEGATIVE NEGATIVE mg/dL   Hgb urine dipstick SMALL (A) NEGATIVE   Bilirubin Urine NEGATIVE NEGATIVE   Ketones, ur NEGATIVE NEGATIVE mg/dL   Protein, ur NEGATIVE NEGATIVE mg/dL   Nitrite POSITIVE (A) NEGATIVE   Leukocytes,Ua NEGATIVE NEGATIVE   RBC / HPF 0-5 0 - 5 RBC/hpf   WBC, UA 6-10 0 - 5 WBC/hpf   Bacteria, UA RARE (A) NONE SEEN   Squamous Epithelial / LPF 6-10 0 - 5   Mucus PRESENT     Comment: Performed at [facility], [place], [place] [address]  Troponin I (High Sensitivity)     Status: None   Collection Time: 10/01/20 10:18 AM  Result Value Ref Range   Troponin I (High Sensitivity) 9 <18 ng/L    Comment: (NOTE) Elevated high sensitivity troponin I (hsTnI) values and significant  changes across serial measurements may suggest ACS but many other  chronic and acute conditions are known to elevate hsTnI results.  Refer to the "Links" section for chest pain algorithms and additional  guidance. Performed at [facility], [address], [place], [place] [address]   Urine culture     Status: Abnormal   Collection Time: 10/01/20 10:18 AM   Specimen: Urine, Random  Result Value Ref Range   Specimen Description      URINE, RANDOM Performed at [facility], [address] [place], [place] [address]    Special Requests      NONE Performed at [facility], [place], [place] [address]    Culture >=100,000 COLONIES/mL ESCHERICHIA COLI (A)    Report Status 10/03/2020 FINAL    Organism ID, Bacteria ESCHERICHIA COLI (A)       Susceptibility   Escherichia coli - MIC*    AMPICILLIN <=2 SENSITIVE Sensitive     CEFAZOLIN <=4 SENSITIVE Sensitive     CEFEPIME <=0.12 SENSITIVE Sensitive     CEFTRIAXONE <=0.25 SENSITIVE Sensitive     CIPROFLOXACIN <=0.25 SENSITIVE Sensitive     GENTAMICIN <=1 SENSITIVE Sensitive     IMIPENEM <=0.25 SENSITIVE Sensitive     NITROFURANTOIN 64 INTERMEDIATE Intermediate     TRIMETH/SULFA <=20 SENSITIVE Sensitive     AMPICILLIN/SULBACTAM <=2 SENSITIVE Sensitive     PIP/TAZO <=4 SENSITIVE Sensitive     * >=100,000 COLONIES/mL ESCHERICHIA COLI   Objective  Body mass index is 39.38 kg/m. Wt Readings from Last 3 Encounters:  12/12/20 244 lb (110.7 kg)  10/04/20 234 lb (106.1 kg)  06/13/20 250 lb 9.6 oz (113.7 kg)   Temp Readings from Last 3 Encounters:  12/12/20 97.9 F (36.6 C) (Oral)  10/04/20 98.8 F (37.1 C) (Oral)  10/01/20  99.8 F (37.7 C) (Oral)   BP Readings from Last 3 Encounters:  12/12/20 124/78  10/01/20 116/75  06/13/20 122/84   Pulse Readings from Last 3 Encounters:  12/12/20 90  10/01/20 78  06/13/20 77    Physical Exam Vitals and nursing note reviewed.  Constitutional:      Appearance: Normal appearance. She is well-developed and well-groomed. She is obese.  HENT:     Head: Normocephalic and atraumatic.  Eyes:     Conjunctiva/sclera: Conjunctivae normal.     Pupils: Pupils are equal, round, and reactive to light.  Cardiovascular:     Rate and Rhythm: Normal rate and regular rhythm.     Heart sounds: Normal heart sounds. No murmur heard.   Pulmonary:     Effort: Pulmonary effort is normal.     Breath sounds: Normal breath sounds.  Chest:      Chest wall: No mass.  Breasts: Breasts are symmetrical.     Right: Normal. No axillary adenopathy.     Left: Normal. No axillary adenopathy.    Abdominal:     Tenderness: There is no abdominal tenderness.  Lymphadenopathy:     Upper Body:     Right upper body: No axillary adenopathy.     Left upper body: No axillary adenopathy.  Skin:    General: Skin is warm and dry.  Neurological:     General: No focal deficit present.     Mental Status: She is alert and oriented to person, place, and time. Mental status is at baseline.     Gait: Gait normal.  Psychiatric:        Attention and Perception: Attention and perception normal.        Mood and Affect: Mood and affect normal.        Speech: Speech normal.        Behavior: Behavior normal. Behavior is cooperative.        Thought Content: Thought content normal.        Cognition and Memory: Cognition and memory normal.        Judgment: Judgment normal.     Assessment  Plan  Annual physical exam Declines flu shot  Tdap per pt had in 2014 declinespna 23, shingrix/ Considering covid 19 vaccine  Mammogram11/5/20 normalordered pt needs to call and sch disc [facility] w/o insurance   Colonoscopy2-19-20 hyperplastic polyps f/u 10 years -flex sig 05/25/19 and colonoscopy 11/16/18  -pap 12/07/19 negative   Smoker rec smoking cessation on and off since age 30 max 1 ppd now 1ppd FH lung cancer -as of 09/07/19 denies smoking cigs  US thyroid 11/23/18 left lower nodules f/u 1 yearpt needs to call ENT for f/u and consider Dr. Harlow Asa in future to remove thyroid vs ENT  -1/6/21thyroid USstable thyroid nodule f/u in 1 yearordered and due as of 12/12/20   Eye Dr. Gloriann Loan  rec healthy diet and exercise   Onychomycosis - Plan: terbinafine (LAMISIL) 250 MG tablet qd x 3 months  Type 2 diabetes mellitus without complication, without long-term current use of insulin (HCC) On metformin xr 500 mg qd  Due for eye exam    Paresthesia  Consider NCS w/u CTS with neurology   Headache, chronic, new features or increased frequency; brain cyst hx s/p removal in 1982 with h/a x months and increased freq Dx: Brain cyst [G93.0 (ICD-10-CM)]; Chronic intractable headache, unspecified headache type [R51.9, G89.29 (ICD-10-CM)]; S/P brain surgery [Z98.890 (ICD-10-CM)]   US thyroid due w/o insurance will call  back when has insurance   Above will hold on MRI and US thyroid due to no insurance US thyroid and MRI when pt gets insurance let me know as of 12/12/20     Provider: Dr. Olivia Mackie McLean-Scocuzza-Internal Medicine

## 2020-12-12 NOTE — Patient Instructions (Addendum)
Dr. Harle Stanford in [place] Dr. Claudia Desanctis in [facility] in [place]   Consider eye doctor-Dr. Gloriann Loan   Call me back when insurance gets activated   [facility] in [place] on [address]  -laser hair

## 2020-12-30 ENCOUNTER — Telehealth: Payer: Self-pay | Admitting: Internal Medicine

## 2020-12-30 NOTE — Telephone Encounter (Signed)
lft vm for pt to call ofc to sch US thyroid.thanks

## 2021-01-16 ENCOUNTER — Emergency Department
Admission: EM | Admit: 2021-01-16 | Discharge: 2021-01-16 | Disposition: A | Discharge status: Home / Self Care | Payer: Self-pay | Attending: Emergency Medicine | Admitting: Emergency Medicine

## 2021-01-16 ENCOUNTER — Other Ambulatory Visit: Payer: Self-pay

## 2021-01-16 ENCOUNTER — Emergency Department: Payer: Self-pay

## 2021-01-16 DIAGNOSIS — Z87891 Personal history of nicotine dependence: Secondary | ICD-10-CM | POA: Insufficient documentation

## 2021-01-16 DIAGNOSIS — Z8616 Personal history of COVID-19: Secondary | ICD-10-CM | POA: Insufficient documentation

## 2021-01-16 DIAGNOSIS — E785 Hyperlipidemia, unspecified: Secondary | ICD-10-CM | POA: Insufficient documentation

## 2021-01-16 DIAGNOSIS — J40 Bronchitis, not specified as acute or chronic: Secondary | ICD-10-CM | POA: Insufficient documentation

## 2021-01-16 DIAGNOSIS — E1169 Type 2 diabetes mellitus with other specified complication: Secondary | ICD-10-CM | POA: Insufficient documentation

## 2021-01-16 DIAGNOSIS — Z86011 Personal history of benign neoplasm of the brain: Secondary | ICD-10-CM | POA: Insufficient documentation

## 2021-01-16 DIAGNOSIS — Z79899 Other long term (current) drug therapy: Secondary | ICD-10-CM | POA: Insufficient documentation

## 2021-01-16 DIAGNOSIS — Z7984 Long term (current) use of oral hypoglycemic drugs: Secondary | ICD-10-CM | POA: Insufficient documentation

## 2021-01-16 LAB — BASIC METABOLIC PANEL
Anion gap: 7 (ref 5–15)
BUN: 13 mg/dL (ref 6–20)
CO2: 25 mmol/L (ref 22–32)
Calcium: 9.5 mg/dL (ref 8.9–10.3)
Chloride: 106 mmol/L (ref 98–111)
Creatinine, Ser: 1.21 mg/dL — ABNORMAL HIGH (ref 0.44–1.00)
GFR, Estimated: 53 mL/min — ABNORMAL LOW (ref >60)
Glucose, Bld: 101 mg/dL — ABNORMAL HIGH (ref 70–99)
Potassium: 3.9 mmol/L (ref 3.5–5.1)
Sodium: 138 mmol/L (ref 135–145)

## 2021-01-16 LAB — CBC
HCT: 37.1 % (ref 36.0–46.0)
Hemoglobin: 12.1 g/dL (ref 12.0–15.0)
MCH: 26.2 pg (ref 26.0–34.0)
MCHC: 32.6 g/dL (ref 30.0–36.0)
MCV: 80.3 fL (ref 80.0–100.0)
Platelets: 245 10*3/uL (ref 150–400)
RBC: 4.62 MIL/uL (ref 3.87–5.11)
RDW: 15.3 % (ref 11.5–15.5)
WBC: 14 10*3/uL — ABNORMAL HIGH (ref 4.0–10.5)
nRBC: 0 % (ref 0.0–0.2)

## 2021-01-16 LAB — PROCALCITONIN: Procalcitonin: <0.1 ng/mL

## 2021-01-16 LAB — TROPONIN I (HIGH SENSITIVITY)
Troponin I (High Sensitivity): 3 ng/L (ref <18)
Troponin I (High Sensitivity): 3 ng/L (ref <18)

## 2021-01-16 LAB — D-DIMER, QUANTITATIVE: D-Dimer, Quant: <0.27 ug/mL-FEU (ref 0.00–0.50)

## 2021-01-16 MED ORDER — IPRATROPIUM-ALBUTEROL 0.5-2.5 (3) MG/3ML IN SOLN
3.0000 mL | Freq: Once | RESPIRATORY_TRACT | Status: AC
  Administered 2021-01-16: 3 mL via RESPIRATORY_TRACT
  Filled 2021-01-16: qty 3

## 2021-01-16 MED ORDER — ALBUTEROL SULFATE HFA 108 (90 BASE) MCG/ACT IN AERS: 2.0000 | INHALATION_SPRAY | Freq: Four times a day (QID) | RESPIRATORY_TRACT | 2 refills | Status: AC | PRN

## 2021-01-16 NOTE — ED Notes (Signed)
Patient stable, no s/s of distress noted. Patient denies pain at this time. Patient resting comfortably in bed.

## 2021-01-16 NOTE — ED Provider Notes (Signed)
[facility] Emergency Department Provider Note  ____________________________________________  Time seen: Approximately 2:12 AM  I have reviewed the triage vital signs and the nursing notes.   HISTORY  Chief Complaint Shortness of Breath and Chest Pain   HPI Carmen Cooley is a 55 y.o. female with a history of smoking, COVID-19 on January 2022, recently diagnosed diabetic who presents for evaluation of chest pain.  Patient reports that she has been having intermittent episodes of chest tightness associated with shortness of breath over the last few days.  They come at random times and not necessarily associated with exertion or laying down.  She had a more severe episode this evening.  She reports that she has been very anxious because she was recently diagnosed with diabetes and started on metformin.  She denies changes on her chronic cough.  She reports that she stopped smoking a few months ago.  She denies fever or chills.  She denies pleuritic chest pain, personal or family history.  DVT, recent travel immobilization, leg pain or swelling, hemoptysis, or exogenous hormones.  She has no chest pain at this time.   Past Medical History:  Diagnosis Date  . Allergy   . Brain tumor ([place])    benign  . COVID-19    10/01/20  . DM type 2 (diabetes mellitus, type 2) ([place]) 06/17/2020  . History of blood transfusion   . Migraines   . Seizure ([place])   . UTI (urinary tract infection)     Patient Active Problem List   Diagnosis Date Noted  . Insomnia 06/18/2020  . Morbid obesity with BMI of 40.0-44.9, adult ([place]) 06/18/2020  . Chronic pain of both shoulders 06/18/2020  . S/P brain surgery 06/18/2020  . Anxiety 06/18/2020  . Type 2 diabetes mellitus without complication, without long-term current use of insulin ([place]) 06/17/2020  . Multinodular goiter 06/13/2020  . Umbilical hernia without obstruction and without gangrene 06/13/2020  . Cervical polyp 12/07/2019  .  Intertrigo 12/07/2019  . Onychomycosis 10/12/2019  . Annual physical exam 09/07/2019  . Leukocytosis 09/07/2019  . Tinea pedis of both feet 09/07/2019  . Hyperlipidemia 11/30/2018  . Thyroid nodule 11/30/2018  . Iron deficiency 11/30/2018  . Vitamin D deficiency 11/03/2018  . Upper back pain on left side 11/02/2018  . Goiter 11/02/2018  . Cough 11/02/2018  . Chronic left shoulder pain 11/02/2018  . Tobacco abuse 11/02/2018    Past Surgical History:  Procedure Laterality Date  . 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 CESAREAN SECTION     x 1  . COLONOSCOPY WITH PROPOFOL N/A 11/16/2018   Procedure: COLONOSCOPY WITH PROPOFOL;  Surgeon: Jonathon Bellows, MD;  Location: [facility] ENDOSCOPY;  Service: Gastroenterology;  Laterality: N/A;  . FLEXIBLE SIGMOIDOSCOPY N/A 05/25/2019   Procedure: FLEXIBLE SIGMOIDOSCOPY;  Surgeon: Jonathon Bellows, MD;  Location: [facility] ENDOSCOPY;  Service: Gastroenterology;  Laterality: N/A;  . FOOT SURGERY    . KNEE SURGERY      Prior to Admission medications   Medication Sig Start Date End Date Taking? Authorizing Provider  albuterol (VENTOLIN HFA) 108 (90 Base) MCG/ACT inhaler Inhale 2 puffs into the lungs every 6 (six) hours as needed for wheezing or shortness of breath. 01/16/21  Yes Alfred Levins, [place], MD  butorphanol (STADOL) 10 MG/ML nasal spray Place 1 spray into the nose once as needed for up to 1 dose for headache. May repeat in 60-90 min then Q4-6 hrs x 2-4 days  06/18/20   McLean-Scocuzza, Nino Glow, MD  cholecalciferol (VITAMIN D3) 25 MCG (1000 UNIT) tablet Take 1,000 Units by mouth daily.    [provider]  metFORMIN (GLUCOPHAGE XR) 500 MG 24 hr tablet Take 1 tablet (500 mg total) by mouth daily with breakfast. 09/16/20   McLean-Scocuzza, Nino Glow, MD  Multiple Vitamin (MULTIVITAMIN) tablet Take 1 tablet by mouth daily.    [provider]  ondansetron (ZOFRAN ODT) 4 MG disintegrating tablet Take 1 tablet (4 mg total) by mouth  every 8 (eight) hours as needed for nausea or vomiting. Patient not taking: Reported on 12/12/2020 10/01/20   Blake Divine, MD  terbinafine (LAMISIL) 250 MG tablet Take 1 tablet (250 mg total) by mouth daily. 12/12/20   McLean-Scocuzza, Nino Glow, MD    Allergies Compazine [prochlorperazine] and Contrast media [iodinated diagnostic agents]  Family History  Problem Relation Age of Onset  . Diabetes Father   . Cancer Father        sinus   . Hypertension Father   . Cancer Other   . Cancer Mother        pancreatic/whipple/chemo  . Hypertension Mother   . Cancer Maternal Grandmother        lung smoker  . Cancer Paternal Grandmother        elbow per pt     Social History Social History   Tobacco Use  . Smoking status: Former Smoker    Packs/day: 0.50    Years: 33.00    Pack years: 16.50    Quit date: 09/28/2018    Years since quitting: 2.3  . Smokeless tobacco: Never Used  Vaping Use  . Vaping Use: Never used  Substance Use Topics  . Alcohol use: Not Currently    Comment: occasional  . Drug use: No    Review of Systems  Constitutional: Negative for fever. Eyes: Negative for visual changes. ENT: Negative for sore throat. Neck: No neck pain  Cardiovascular: Negative for chest pain. + chest tightness Respiratory: + shortness of breath. Gastrointestinal: Negative for abdominal pain, vomiting or diarrhea. Genitourinary: Negative for dysuria. Musculoskeletal: Negative for back pain. Skin: Negative for rash. Neurological: Negative for headaches, weakness or numbness. Psych: No SI or HI  ____________________________________________   PHYSICAL EXAM:  VITAL SIGNS: ED Triage Vitals  Enc Vitals Group     BP 01/16/21 0032 132/86     Pulse Rate 01/16/21 0032 70     Resp 01/16/21 0032 18     Temp 01/16/21 0032 97.8 F (36.6 C)     Temp Source 01/16/21 0032 Oral     SpO2 01/16/21 0032 100 %     Weight 01/16/21 0031 135 lb (61.2 kg)     Height 01/16/21 0031 5\' 6"  (1.676  m)     Head Circumference --      Peak Flow --      Pain Score 01/16/21 0030 2     Pain Loc --      Pain Edu? --      Excl. in [place]? --     Constitutional: Alert and oriented. Well appearing and in no apparent distress. HEENT:      Head: Normocephalic and atraumatic.         Eyes: Conjunctivae are normal. Sclera is non-icteric.       Mouth/Throat: Mucous membranes are moist.       Neck: Supple with no signs of meningismus. Cardiovascular: Regular rate and rhythm. No murmurs, gallops, or rubs. 2+ symmetrical  distal pulses are present in all extremities. No JVD. Respiratory: Normal respiratory effort. Lungs are clear to auscultation bilaterally.  Gastrointestinal: Soft, non tender, and non distended. Musculoskeletal:  No edema, cyanosis, or erythema of extremities. Neurologic: Normal speech and language. Face is symmetric. Moving all extremities. No gross focal neurologic deficits are appreciated. Skin: Skin is warm, dry and intact. No rash noted. Psychiatric: Mood and affect are normal. Speech and behavior are normal.  ____________________________________________   LABS (all labs ordered are listed, but only abnormal results are displayed)  Labs Reviewed  CBC - Abnormal; Notable for the following components:      Result Value   WBC 14.0 (*)    All other components within normal limits  BASIC METABOLIC PANEL - Abnormal; Notable for the following components:   Glucose, Bld 101 (*)    Creatinine, Ser 1.21 (*)    GFR, Estimated 53 (*)    All other components within normal limits  D-DIMER, QUANTITATIVE  PROCALCITONIN  TROPONIN I (HIGH SENSITIVITY)  TROPONIN I (HIGH SENSITIVITY)   ____________________________________________  EKG  ED ECG REPORT I, Rudene Re, the attending physician, personally viewed and interpreted this ECG.  Normal sinus rhythm, rate of 68, normal intervals, normal axis, no ST elevations or depressions.  Unchanged from prior from January  2022 ____________________________________________  RADIOLOGY  I have personally reviewed the images performed during this visit and I agree with the Radiologist's read.   Interpretation by Radiologist:  DG Chest Portable 1 View  Result Date: 01/16/2021 CLINICAL DATA:  Chest shortness of breath EXAM: PORTABLE CHEST 1 VIEW COMPARISON:  10/01/2020 FINDINGS: The heart size and mediastinal contours are within normal limits. Both lungs are clear. The visualized skeletal structures are unremarkable. IMPRESSION: No active disease. Electronically Signed   By: Ulyses Jarred M.D.   On: 01/16/2021 01:20    ____________________________________________   PROCEDURES  Procedure(s) performed: .1-3 Lead EKG Interpretation Performed by: Rudene Re, MD Authorized by: Rudene Re, MD     Interpretation: non-specific     ECG rate assessment: normal     Rhythm: sinus rhythm     Ectopy: none     Conduction: normal     Critical Care performed:  None ____________________________________________   INITIAL IMPRESSION / ASSESSMENT AND PLAN / ED COURSE  55 y.o. female with a history of smoking, COVID-19 on January 2022, recently diagnosed diabetic who presents for evaluation of intermittent chest tightness associated with shortness of breath over the last few days.  She is pain-free and having no shortness of breath at this time.  She is extremely well-appearing in no distress with normal vital signs although oxygen seems to be between 92 and 95%.  She is moving good air with no wheezing.  She looks euvolemic.  EKG with no signs of ischemia.  Ddx anxiety, ACS, PE, bronchitis, COPD, PNA.  Labs show leukocytosis with white count of 14, no signs of DKA.  First troponin is negative.  D-dimer is negative.  Chest x-ray visualized by me and no signs of edema or infiltrate, confirmed by radiology.  Procalcitonin pending.  Will give 2 duo nebs since patient has been satting 92 to 93% and reassess.  Old medical records reviewed.  Patient placed on telemetry for close monitoring of cardiorespiratory status.  _________________________ 4:16 AM on 01/16/2021 -----------------------------------------  Chest x-ray visualized by me with no signs of edema, pneumonia, pneumothorax, confirmed by radiology.  2 high-sensitivity troponins negative therefore making ACS unlikely.  Patient does have a mildly elevated  white count of 14 but no fever and a negative procalcitonin.  D-dimer is negative.  No significant electrolyte derangements.  Patient was given 2 DuoNeb's and feels improved.  Possibly early mild bronchitis but with a negative procalcitonin we will hold off antibiotics.  Will discharge home with a prescription for albuterol and follow-up with PCP.  Discussed my standard return precautions.      _____________________________________________ Please note:  Patient was evaluated in Emergency Department today for the symptoms described in the history of present illness. Patient was evaluated in the context of the global COVID-19 pandemic, which necessitated consideration that the patient might be at risk for infection with the SARS-CoV-2 virus that causes COVID-19. Institutional protocols and algorithms that pertain to the evaluation of patients at risk for COVID-19 are in a state of rapid change based on information released by regulatory bodies including the CDC and federal and state organizations. These policies and algorithms were followed during the patient's care in the ED.  Some ED evaluations and interventions may be delayed as a result of limited staffing during the pandemic.   [place] Controlled Substance Database was reviewed by me. ____________________________________________   FINAL CLINICAL IMPRESSION(S) / ED DIAGNOSES   Final diagnoses:  Bronchitis      NEW MEDICATIONS STARTED DURING THIS VISIT:  ED Discharge Orders         Ordered    albuterol (VENTOLIN HFA) 108 (90 Base)  MCG/ACT inhaler  Every 6 hours PRN        01/16/21 0415           Note:  This document was prepared using Dragon voice recognition software and may include unintentional dictation errors.    Alfred Levins, [place], MD 01/16/21 [phone]

## 2021-01-16 NOTE — ED Triage Notes (Signed)
Pt states for the past few days she has had shortness of breath and chest tightness, pt states worse tonight as well as jittery. Pt states she just found out she was a diabetic, states she has been anxious.

## 2021-03-17 ENCOUNTER — Other Ambulatory Visit: Payer: Self-pay

## 2021-03-24 ENCOUNTER — Telehealth: Payer: Self-pay | Admitting: Internal Medicine

## 2021-03-24 NOTE — Telephone Encounter (Signed)
Lft pt vm to call office to regarding if she has ins. thanks

## 2021-05-29 ENCOUNTER — Telehealth: Payer: Self-pay | Admitting: Internal Medicine

## 2021-05-29 NOTE — Telephone Encounter (Signed)
Lft pt vm to call ofc to sch US thyroid. thanks 

## 2021-07-15 ENCOUNTER — Telehealth: Payer: Self-pay

## 2021-07-15 ENCOUNTER — Ambulatory Visit: Payer: Self-pay | Admitting: Internal Medicine

## 2021-07-15 NOTE — Telephone Encounter (Signed)
Letter has been sent

## 2021-07-15 NOTE — Telephone Encounter (Signed)
Patient no showed today's appointment; Appointment scheduled for 07/15/21. Sent to provider for review.

## 2021-07-15 NOTE — Telephone Encounter (Signed)
Letter to reschedule please

## 2021-10-02 ENCOUNTER — Telehealth: Payer: Self-pay | Admitting: Internal Medicine

## 2021-10-02 NOTE — Telephone Encounter (Signed)
Lab orders already in from 11/2020. Extended labs to not expire until 08/2022 and changed expected date to 12/10/21

## 2021-10-02 NOTE — Telephone Encounter (Signed)
Pt want to schedule cpe and want labs done prior

## 2021-12-10 ENCOUNTER — Other Ambulatory Visit: Payer: Self-pay

## 2021-12-10 ENCOUNTER — Other Ambulatory Visit (INDEPENDENT_AMBULATORY_CARE_PROVIDER_SITE_OTHER): Payer: 59

## 2021-12-10 DIAGNOSIS — E119 Type 2 diabetes mellitus without complications: Secondary | ICD-10-CM | POA: Diagnosis not present

## 2021-12-10 DIAGNOSIS — Z1329 Encounter for screening for other suspected endocrine disorder: Secondary | ICD-10-CM

## 2021-12-10 DIAGNOSIS — E559 Vitamin D deficiency, unspecified: Secondary | ICD-10-CM

## 2021-12-10 LAB — CBC WITH DIFFERENTIAL/PLATELET
Basophils Absolute: 0.1 10*3/uL (ref 0.0–0.1)
Basophils Relative: 0.5 % (ref 0.0–3.0)
Eosinophils Absolute: 0.5 10*3/uL (ref 0.0–0.7)
Eosinophils Relative: 4.3 % (ref 0.0–5.0)
HCT: 37 % (ref 36.0–46.0)
Hemoglobin: 12.1 g/dL (ref 12.0–15.0)
Lymphocytes Relative: 55.7 % — ABNORMAL HIGH (ref 12.0–46.0)
Lymphs Abs: 6 10*3/uL — ABNORMAL HIGH (ref 0.7–4.0)
MCHC: 32.8 g/dL (ref 30.0–36.0)
MCV: 80 fl (ref 78.0–100.0)
Monocytes Absolute: 0.5 10*3/uL (ref 0.1–1.0)
Monocytes Relative: 4.4 % (ref 3.0–12.0)
Neutro Abs: 3.8 10*3/uL (ref 1.4–7.7)
Neutrophils Relative %: 35.1 % — ABNORMAL LOW (ref 43.0–77.0)
Platelets: 236 10*3/uL (ref 150.0–400.0)
RBC: 4.62 Mil/uL (ref 3.87–5.11)
RDW: 15.7 % — ABNORMAL HIGH (ref 11.5–15.5)
WBC: 10.7 10*3/uL — ABNORMAL HIGH (ref 4.0–10.5)

## 2021-12-10 LAB — LIPID PANEL
Cholesterol: 155 mg/dL (ref 0–200)
HDL: 42.6 mg/dL (ref >39.00)
LDL Cholesterol: 85 mg/dL (ref 0–99)
NonHDL: 112.74
Total CHOL/HDL Ratio: 4
Triglycerides: 137 mg/dL (ref 0.0–149.0)
VLDL: 27.4 mg/dL (ref 0.0–40.0)

## 2021-12-10 LAB — COMPREHENSIVE METABOLIC PANEL
ALT: 14 U/L (ref 0–35)
AST: 13 U/L (ref 0–37)
Albumin: 4.2 g/dL (ref 3.5–5.2)
Alkaline Phosphatase: 50 U/L (ref 39–117)
BUN: 13 mg/dL (ref 6–23)
CO2: 25 mEq/L (ref 19–32)
Calcium: 9.2 mg/dL (ref 8.4–10.5)
Chloride: 108 mEq/L (ref 96–112)
Creatinine, Ser: 0.86 mg/dL (ref 0.40–1.20)
GFR: 75.59 mL/min (ref >60.00)
Glucose, Bld: 108 mg/dL — ABNORMAL HIGH (ref 70–99)
Potassium: 3.6 mEq/L (ref 3.5–5.1)
Sodium: 140 mEq/L (ref 135–145)
Total Bilirubin: 0.3 mg/dL (ref 0.2–1.2)
Total Protein: 7.1 g/dL (ref 6.0–8.3)

## 2021-12-10 LAB — VITAMIN D 25 HYDROXY (VIT D DEFICIENCY, FRACTURES): VITD: 15.79 ng/mL — ABNORMAL LOW (ref 30.00–100.00)

## 2021-12-10 LAB — HEMOGLOBIN A1C: Hgb A1c MFr Bld: 6.7 % — ABNORMAL HIGH (ref 4.6–6.5)

## 2021-12-10 LAB — TSH: TSH: 2.31 u[IU]/mL (ref 0.35–5.50)

## 2021-12-16 ENCOUNTER — Encounter: Payer: Self-pay | Admitting: Internal Medicine

## 2021-12-16 ENCOUNTER — Other Ambulatory Visit: Payer: Self-pay

## 2021-12-17 ENCOUNTER — Telehealth: Payer: Self-pay | Admitting: Internal Medicine

## 2021-12-17 ENCOUNTER — Encounter: Payer: Self-pay | Admitting: Internal Medicine

## 2021-12-17 ENCOUNTER — Ambulatory Visit (INDEPENDENT_AMBULATORY_CARE_PROVIDER_SITE_OTHER): Payer: 59 | Admitting: Internal Medicine

## 2021-12-17 ENCOUNTER — Other Ambulatory Visit: Payer: Self-pay

## 2021-12-17 ENCOUNTER — Ambulatory Visit (INDEPENDENT_AMBULATORY_CARE_PROVIDER_SITE_OTHER): Payer: 59

## 2021-12-17 VITALS — BP 122/84 | HR 71 | Temp 98.1°F | Ht 65.95 in | Wt 234.0 lb

## 2021-12-17 DIAGNOSIS — Z0000 Encounter for general adult medical examination without abnormal findings: Secondary | ICD-10-CM

## 2021-12-17 DIAGNOSIS — L987 Excessive and redundant skin and subcutaneous tissue: Secondary | ICD-10-CM

## 2021-12-17 DIAGNOSIS — E041 Nontoxic single thyroid nodule: Secondary | ICD-10-CM | POA: Diagnosis not present

## 2021-12-17 DIAGNOSIS — Z1231 Encounter for screening mammogram for malignant neoplasm of breast: Secondary | ICD-10-CM

## 2021-12-17 DIAGNOSIS — R059 Cough, unspecified: Secondary | ICD-10-CM

## 2021-12-17 DIAGNOSIS — M549 Dorsalgia, unspecified: Secondary | ICD-10-CM | POA: Diagnosis not present

## 2021-12-17 DIAGNOSIS — M19012 Primary osteoarthritis, left shoulder: Secondary | ICD-10-CM | POA: Diagnosis not present

## 2021-12-17 DIAGNOSIS — E559 Vitamin D deficiency, unspecified: Secondary | ICD-10-CM

## 2021-12-17 DIAGNOSIS — L304 Erythema intertrigo: Secondary | ICD-10-CM

## 2021-12-17 MED ORDER — CHOLECALCIFEROL 1.25 MG (50000 UT) PO CAPS: 50000.0000 [IU] | ORAL_CAPSULE | ORAL | 1 refills | Status: AC

## 2021-12-17 NOTE — Progress Notes (Signed)
Chief Complaint  ?Patient presents with  ? Annual Exam  ? ?Annual  ?1. Left upper back pain with h/o flu 07/2021 and still having a cough since then worse in the am denies smoking nagging pain at times 4-5/10 then 1-2/10 nothing tried prior xray left shoulder + arthritis  ?2. Thyroid nodules due to f/u ENT and thyroid US  ?3. Excess skin abdomen losing wt but has excess skin and skin rashes to p[place]us ?4. Reduced hearing left ear + wax  ? ? ?Review of Systems  ?Constitutional:  Negative for weight loss.  ?HENT:  Negative for hearing loss.   ?Eyes:  Negative for blurred vision.  ?Respiratory:  Negative for shortness of breath.   ?Cardiovascular:  Negative for chest pain.  ?Gastrointestinal:  Negative for abdominal pain and blood in stool.  ?Genitourinary:  Negative for dysuria.  ?Musculoskeletal:  Negative for falls and joint pain.  ?Skin:  Negative for rash.  ?Neurological:  Negative for headaches.  ?Psychiatric/Behavioral:  Negative for depression.   ?Past Medical History:  ?Diagnosis Date  ? Allergy   ? Brain tumor ([place])   ? benign  ? COVID-19   ? 10/01/20  ? DM type 2 (diabetes mellitus, type 2) ([place]) 06/17/2020  ? History of blood transfusion   ? Migraines   ? Seizure ([place])   ? UTI (urinary tract infection)   ? ?Past Surgical History:  ?Procedure Laterality Date  ? BRAIN TUMOR EXCISION    ? 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   ? CESAREAN SECTION    ? x 1  ? COLONOSCOPY WITH PROPOFOL N/A 11/16/2018  ? Procedure: COLONOSCOPY WITH PROPOFOL;  Surgeon: Jonathon Bellows, MD;  Location: [facility] ENDOSCOPY;  Service: Gastroenterology;  Laterality: N/A;  ? FLEXIBLE SIGMOIDOSCOPY N/A 05/25/2019  ? Procedure: FLEXIBLE SIGMOIDOSCOPY;  Surgeon: Jonathon Bellows, MD;  Location: [facility] ENDOSCOPY;  Service: Gastroenterology;  Laterality: N/A;  ? FOOT SURGERY    ? KNEE SURGERY    ? ?Family History  ?Problem Relation Age of Onset  ? Diabetes Father   ? Cancer Father   ?     sinus   ? Hypertension Father   ? Cancer Other   ? Cancer Mother   ?      pancreatic/whipple/chemo  ? Hypertension Mother   ? Cancer Maternal Grandmother   ?     lung smoker  ? Cancer Paternal Grandmother   ?     elbow per pt   ? ?Social History  ? ?Socioeconomic History  ? Marital status: Single  ?  Spouse name: Not on file  ? Number of children: Not on file  ? Years of education: Not on file  ? Highest education level: Not on file  ?Occupational History  ? Not on file  ?Tobacco Use  ? Smoking status: Former  ?  Packs/day: 0.50  ?  Years: 33.00  ?  Pack years: 16.50  ?  Types: Cigarettes  ?  Quit date: 09/28/2018  ?  Years since quitting: 3.2  ? Smokeless tobacco: Never  ?Vaping Use  ? Vaping Use: Never used  ?Substance and Sexual Activity  ? Alcohol use: Not Currently  ?  Comment: occasional  ? Drug use: No  ? Sexual activity: Not Currently  ?Other Topics Concern  ? Not on file  ?Social History Narrative  ? 4 kids   ? Single   ? Some college, truck driver   ? No guns   ? Wears seat belt   ? Safe  in relationship  ? Truck driver for [employer]   ? Mom is [employer] lat[employer]   ? ?Social Determinants of Health  ? ?Financial Resource Strain: Not on file  ?Food Insecurity: Not on file  ?Transportation Needs: Not on file  ?Physical Activity: Not on file  ?Stress: Not on file  ?Social Connections: Not on file  ?Intimate Partner Violence: Not on file  ? ?Current Meds  ?Medication Sig  ? Cholecalciferol 1.25 MG (50000 UT) capsule Take 1 capsule (50,000 Units total) by mouth once a week. D3  ? Multiple Vitamin (MULTIVITAMIN) tablet Take 1 tablet by mouth daily.  ? [DISCONTINUED] cholecalciferol (VITAMIN D3) 25 MCG (1000 UNIT) tablet Take 1,000 Units by mouth daily.  ? [DISCONTINUED] metFORMIN (GLUCOPHAGE XR) 500 MG 24 hr tablet Take 1 tablet (500 mg total) by mouth daily with breakfast.  ? ?Allergies  ?Allergen Reactions  ? Compazine [Prochlorperazine] Shortness Of Breath  ? Contrast Media [Iodinated Contrast Media] Hives  ? ?Recent Results (from the past 2160 hour(s))  ?Vitamin D (25 hydroxy)     Status:  Abnormal  ? Collection Time: 12/10/21  7:51 AM  ?Result Value Ref Range  ? VITD 15.79 (L) 30.00 - 100.00 ng/mL  ?TSH     Status: None  ? Collection Time: 12/10/21  7:51 AM  ?Result Value Ref Range  ? TSH 2.31 0.35 - 5.50 uIU/mL  ?CBC with Differential/Platelet     Status: Abnormal  ? Collection Time: 12/10/21  7:51 AM  ?Result Value Ref Range  ? WBC 10.7 (H) 4.0 - 10.5 K/uL  ? RBC 4.62 3.87 - 5.11 Mil/uL  ? Hemoglobin 12.1 12.0 - 15.0 g/dL  ? HCT 37.0 36.0 - 46.0 %  ? MCV 80.0 78.0 - 100.0 fl  ? MCHC 32.8 30.0 - 36.0 g/dL  ? RDW 15.7 (H) 11.5 - 15.5 %  ? Platelets 236.0 150.0 - 400.0 K/uL  ? Neutrophils Relative % 35.1 (L) 43.0 - 77.0 %  ? Lymphocytes Relative 55.7 Repeated and verified X2. (H) 12.0 - 46.0 %  ? Monocytes Relative 4.4 3.0 - 12.0 %  ? Eosinophils Relative 4.3 0.0 - 5.0 %  ? Basophils Relative 0.5 0.0 - 3.0 %  ? Neutro Abs 3.8 1.4 - 7.7 K/uL  ? Lymphs Abs 6.0 (H) 0.7 - 4.0 K/uL  ? Monocytes Absolute 0.5 0.1 - 1.0 K/uL  ? Eosinophils Absolute 0.5 0.0 - 0.7 K/uL  ? Basophils Absolute 0.1 0.0 - 0.1 K/uL  ?Hemoglobin A1c     Status: Abnormal  ? Collection Time: 12/10/21  7:51 AM  ?Result Value Ref Range  ? Hgb A1c MFr Bld 6.7 (H) 4.6 - 6.5 %  ?  Comment: Glycemic Control Guidelines for People with Diabetes:Non Diabetic:  <6%Goal of Therapy: <7%Additional Action Suggested:  >8%   ?Lipid panel     Status: None  ? Collection Time: 12/10/21  7:51 AM  ?Result Value Ref Range  ? Cholesterol 155 0 - 200 mg/dL  ?  Comment: ATP III Classification       Desirable:  < 200 mg/dL               Borderline High:  200 - 239 mg/dL          High:  > = 240 mg/dL  ? Triglycerides 137.0 0.0 - 149.0 mg/dL  ?  Comment: Normal:  <150 mg/dLBorderline High:  150 - 199 mg/dL  ? HDL 42.60 >39.00 mg/dL  ? VLDL 27.4 0.0 - 40.0 mg/dL  ?  LDL Cholesterol 85 0 - 99 mg/dL  ? Total CHOL/HDL Ratio 4   ?  Comment:                Men          Women1/2 Average Risk     3.4          3.3Average Risk          5.0          4.42X Average Risk           9.6          7.13X Average Risk          15.0          11.0                      ? NonHDL 112.74   ?  Comment: NOTE:  Non-HDL goal should be 30 mg/dL higher than patient's LDL goal (i.e. LDL goal of < 70 mg/dL, would have non-HDL goal of < 100 mg/dL)  ?Comprehensive metabolic panel     Status: Abnormal  ? Collection Time: 12/10/21  7:51 AM  ?Result Value Ref Range  ? Sodium 140 135 - 145 mEq/L  ? Potassium 3.6 3.5 - 5.1 mEq/L  ? Chloride 108 96 - 112 mEq/L  ? CO2 25 19 - 32 mEq/L  ? Glucose, Bld 108 (H) 70 - 99 mg/dL  ? BUN 13 6 - 23 mg/dL  ? Creatinine, Ser 0.86 0.40 - 1.20 mg/dL  ? Total Bilirubin 0.3 0.2 - 1.2 mg/dL  ? Alkaline Phosphatase 50 39 - 117 U/L  ? AST 13 0 - 37 U/L  ? ALT 14 0 - 35 U/L  ? Total Protein 7.1 6.0 - 8.3 g/dL  ? Albumin 4.2 3.5 - 5.2 g/dL  ? GFR 75.59 >60.00 mL/min  ?  Comment: Calculated using the CKD-EPI Creatinine Equation (2021)  ? Calcium 9.2 8.4 - 10.5 mg/dL  ? ?Objective  ?Body mass index is 21.83 kg/m?. ?Wt Readings from Last 3 Encounters:  ?01/16/21 135 lb (61.2 kg)  ?12/12/20 244 lb (110.7 kg)  ?10/04/20 234 lb (106.1 kg)  ? ?Temp Readings from Last 3 Encounters:  ?12/17/21 98.1 ?F (36.7 ?C) (Oral)  ?01/16/21 97.8 ?F (36.6 ?C) (Oral)  ?12/12/20 97.9 ?F (36.6 ?C) (Oral)  ? ?BP Readings from Last 3 Encounters:  ?12/17/21 122/84  ?01/16/21 93/64  ?12/12/20 124/78  ? ?Pulse Readings from Last 3 Encounters:  ?12/17/21 71  ?01/16/21 66  ?12/12/20 90  ? ? ?Physical Exam ?Vitals and nursing note reviewed.  ?Constitutional:   ?   Appearance: Normal appearance. She is well-developed and well-groomed.  ?HENT:  ?   Head: Normocephalic and atraumatic.  ?Eyes:  ?   Conjunctiva/sclera: Conjunctivae normal.  ?   Pupils: Pupils are equal, round, and reactive to light.  ?Cardiovascular:  ?   Rate and Rhythm: Normal rate and regular rhythm.  ?   Heart sounds: Normal heart sounds. No murmur heard. ?Pulmonary:  ?   Effort: Pulmonary effort is normal.  ?   Breath sounds: Normal breath sounds.   ?Abdominal:  ?   General: Abdomen is flat. Bowel sounds are normal.  ?   Tenderness: There is no abdominal tenderness.  ?Musculoskeletal:     ?   General: No tenderness.  ?Skin: ?   General: Skin is warm and dry.  ?Neur

## 2021-12-17 NOTE — Telephone Encounter (Signed)
Lft pt vm to call ofc to sch US. thanks ?

## 2021-12-17 NOTE — Patient Instructions (Addendum)
?Let me know about orthopedics if you want a referral ? ?Call Dr .Tasia Catchings for appt and call mammogram  ?About your white blood cell count  ? ?MD Physician  ? ?Primary Contact Information ? ?Phone Fax E-mail Address  ?[phone] Not available [place] [address]  ? ?MD Physician  ? ? ? ?Thyroid US, ENT and plastics will call you  ? ?Phone Fax E-mail Address  ?[phone] Not available [address]  ? [place] [address]  ?   ?Specialties     ?Plastic Surgery     ? ?Vitamin D Deficiency ?Vitamin D deficiency is when your body does not have enough vitamin D. Vitamin D is important to your body for many reasons: ?It helps the body absorb two important minerals--calcium and phosphorus. ?It plays a role in bone health. ?It may help to prevent some diseases, such as diabetes and multiple sclerosis. ?It plays a role in muscle function, including heart function. ?If vitamin D deficiency is severe, it can cause a condition in which your bones become soft. In adults, this condition is called osteomalacia. In children, this condition is called rickets. ?What are the causes? ?This condition may be caused by: ?Not eating enough foods that contain vitamin D. ?Not getting enough natural sun exposure. ?Having certain digestive system diseases that make it difficult for your body to absorb vitamin D. These diseases include Crohn's disease, chronic pancreatitis, and cystic fibrosis. ?Having a surgery in which a part of the stomach or a part of the small intestine is removed. ?Having chronic kidney disease or liver disease. ?What increases the risk? ?You are more likely to develop this condition if you: ?Are older. ?Do not spend much time outdoors. ?Live in a long-term care facility. ?Have had broken bones. ?Have weak or thin bones (osteoporosis). ?Have a disease or condition that changes how the body absorbs vitamin D. ?Have dark skin. ?Take certain medicines, such as  steroid medicines or certain seizure medicines. ?Are overweight or obese. ?What are the signs or symptoms? ?In mild cases of vitamin D deficiency, there may not be any symptoms. If the condition is severe, symptoms may include: ?Bone pain. ?Muscle pain. ?Falling often. ?Broken bones caused by a minor injury. ?How is this diagnosed? ?This condition may be diagnosed with blood tests. Imaging tests such as X-rays may also be done to look for changes in the bone. ?How is this treated? ?Treatment for this condition may depend on what caused the condition. Treatment options include: ?Taking vitamin D supplements. Your health care provider will suggest what dose is best for you. ?Taking a calcium supplement. Your health care provider will suggest what dose is best for you. ?Follow these instructions at home: ?Eating and drinking ? ?Eat foods that contain vitamin D. Choices include: ?Fortified dairy products, cereals, or juices. Fortified means that vitamin D has been added to the food. Check the label on the package to see if the food is fortified. ?Fatty fish, such as salmon or trout. ?Eggs. ?Oysters. ?Mushrooms. ?The items listed above may not be a complete list of recommended foods and beverages. Contact a dietitian for more information. ?General instructions ?Take medicines and supplements only as told by your health care provider. ?Get regular, safe exposure to natural sunlight. ?Do not use a tanning bed. ?Maintain a healthy weight. Lose weight if needed. ?Keep all follow-up visits as told by your health care provider. This is important. ?How is  this prevented? ?You can get vitamin D by: ?Eating foods that naturally contain vitamin D. ?Eating or drinking products that have been fortified with vitamin D, such as cereals, juices, and dairy products (including milk). ?Taking a vitamin D supplement or a multivitamin supplement that contains vitamin D. ?Being in the sun. Your body naturally makes vitamin D when your skin  is exposed to sunlight. Your body changes the sunlight into a form of the vitamin that it can use. ?Contact a health care provider if: ?Your symptoms do not go away. ?You feel nauseous or you vomit. ?You have fewer bowel movements than usual or are constipated. ?Summary ?Vitamin D deficiency is when your body does not have enough vitamin D. ?Vitamin D is important to your body for good bone health and muscle function, and it may help prevent some diseases. ?Vitamin D deficiency is primarily treated through supplementation. Your health care provider will suggest what dose is best for you. ?You can get vitamin D by eating foods that contain vitamin D, by being in the sun, and by taking a vitamin D supplement or a multivitamin supplement that contains vitamin D. ?This information is not intended to replace advice given to you by your health care provider. Make sure you discuss any questions you have with your health care provider. ?Document Revised: 05/23/2018 Document Reviewed: 05/23/2018 ?Elsevier Patient Education ? [place]. ? ?Diabetes Mellitus and Nutrition, Adult ?When you have diabetes, or diabetes mellitus, it is very important to have healthy eating habits because your blood sugar (glucose) levels are greatly affected by what you eat and drink. Eating healthy foods in the right amounts, at about the same times every day, can help you: ?Manage your blood glucose. ?Lower your risk of heart disease. ?Improve your blood pressure. ?Reach or maintain a healthy weight. ?What can affect my meal plan? ?Every person with diabetes is different, and each person has different needs for a meal plan. Your health care provider may recommend that you work with a dietitian to make a meal plan that is best for you. Your meal plan may vary depending on factors such as: ?The calories you need. ?The medicines you take. ?Your weight. ?Your blood glucose, blood pressure, and cholesterol levels. ?Your activity level. ?Other  health conditions you have, such as heart or kidney disease. ?How do carbohydrates affect me? ?Carbohydrates, also called carbs, affect your blood glucose level more than any other type of food. Eating carbs raises the amount of glucose in your blood. ?It is important to know how many carbs you can safely have in each meal. This is different for every person. Your dietitian can help you calculate how many carbs you should have at each meal and for each snack. ?How does alcohol affect me? ?Alcohol can cause a decrease in blood glucose (hypoglycemia), especially if you use insulin or take certain diabetes medicines by mouth. Hypoglycemia can be a life-threatening condition. Symptoms of hypoglycemia, such as sleepiness, dizziness, and confusion, are similar to symptoms of having too much alcohol. ?Do not drink alcohol if: ?Your health care provider tells you not to drink. ?You are pregnant, may be pregnant, or are planning to become pregnant. ?If you drink alcohol: ?Limit how much you have to: ?0-1 drink a day for women. ?0-2 drinks a day for men. ?Know how much alcohol is in your drink. In the [place] one drink equals one 12 oz bottle of beer (355 mL), one 5 oz glass of wine (148 mL), or one  1? oz glass of hard liquor (44 mL). ?Keep yourself hydrated with water, diet soda, or unsweetened iced tea. Keep in mind that regular soda, juice, and other mixers may contain a lot of sugar and must be counted as carbs. ?What are tips for following this plan? ?Reading food labels ?Start by checking the serving size on the Nutrition Facts label of packaged foods and drinks. The number of calories and the amount of carbs, fats, and other nutrients listed on the label are based on one serving of the item. Many items contain more than one serving per package. ?Check the total grams (g) of carbs in one serving. ?Check the number of grams of saturated fats and trans fats in one serving. Choose foods that have a low amount or none of  these fats. ?Check the number of milligrams (mg) of salt (sodium) in one serving. Most people should limit total sodium intake to less than 2,300 mg per day. ?Always check the nutrition information of foods l

## 2021-12-24 ENCOUNTER — Telehealth: Payer: Self-pay | Admitting: Internal Medicine

## 2021-12-24 NOTE — Addendum Note (Signed)
Addended by: Orland Mustard on: 12/24/2021 12:32 PM ? ? Modules accepted: Orders ? ?

## 2021-12-24 NOTE — Telephone Encounter (Signed)
Lft pt vm to call ofc to sch US thyroid and sent a mychart. Thank you ?

## 2021-12-31 ENCOUNTER — Telehealth: Payer: Self-pay | Admitting: Internal Medicine

## 2021-12-31 NOTE — Telephone Encounter (Signed)
Lft pt vm to call ofc ?

## 2022-01-01 ENCOUNTER — Other Ambulatory Visit: Payer: Self-pay

## 2022-01-01 ENCOUNTER — Ambulatory Visit (INDEPENDENT_AMBULATORY_CARE_PROVIDER_SITE_OTHER): Payer: 59 | Admitting: Plastic Surgery

## 2022-01-01 VITALS — BP 138/87 | HR 74 | Ht 66.0 in | Wt 234.6 lb

## 2022-01-01 DIAGNOSIS — M793 Panniculitis, unspecified: Secondary | ICD-10-CM | POA: Diagnosis not present

## 2022-01-01 NOTE — Progress Notes (Signed)
? ?Referring Provider ?McLean-Scocuzza, Nino Glow, MD ?[address] ?[place],  [place] [address]  ? ?CC: No chief complaint on file. ?   ? ?Carmen Cooley is an 56 y.o. female.  ?HPI: Patient presents to discuss her abdominal contour.  She is bothered by overhanging skin that is becoming more apparent as she is starting to lose weight.  She gets rashes beneath that been refractory to over-the-counter treatments.  She reports history of C-section but no other abdominal surgeries.  She does not smoke.  She is a diabetic with a hemoglobin A1c of 6.7.  She does feel like she is continuing to lose weight. ? ?Allergies  ?Allergen Reactions  ? Compazine [Prochlorperazine] Shortness Of Breath  ? Contrast Media [Iodinated Contrast Media] Hives  ? ? ?Outpatient Encounter Medications as of 01/01/2022  ?Medication Sig  ? albuterol (VENTOLIN HFA) 108 (90 Base) MCG/ACT inhaler Inhale 2 puffs into the lungs every 6 (six) hours as needed for wheezing or shortness of breath. (Patient not taking: Reported on 12/16/2021)  ? Cholecalciferol 1.25 MG (50000 UT) capsule Take 1 capsule (50,000 Units total) by mouth once a week. D3  ? Multiple Vitamin (MULTIVITAMIN) tablet Take 1 tablet by mouth daily.  ? ?No facility-administered encounter medications on file as of 01/01/2022.  ?  ? ?Past Medical History:  ?Diagnosis Date  ? Allergy   ? Brain tumor ([facility])   ? benign  ? COVID-19   ? 10/01/20  ? DM type 2 (diabetes mellitus, type 2) ([facility]) 06/17/2020  ? History of blood transfusion   ? Migraines   ? Seizure ([facility])   ? UTI (urinary tract infection)   ? ? ?Past Surgical History:  ?Procedure Laterality Date  ? BRAIN TUMOR EXCISION    ? 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   ? CESAREAN SECTION    ? x 1  ? COLONOSCOPY WITH PROPOFOL N/A 11/16/2018  ? Procedure: COLONOSCOPY WITH PROPOFOL;  Surgeon: Jonathon Bellows, MD;  Location[facility] END[facility]COPY;  Service: Gastroenterology;  Laterality: N/A;  ? FLEXIBLE SIGMOIDOSCOPY N/A 05/25/2019  ? Procedure: FLEXIBLE SIGMOIDOSCOPY;   Surgeon: Jonathon Bellows, MD;  Location[facility] ENDOSCOPY;  Service: Gastroenterology;  Laterality: N/A;  ? FOOT SURGERY    ? KNEE SURGERY    ? ? ?Family History  ?Problem Relation Age of Onset  ? Diabetes Father   ? Cancer Father   ?     sinus   ? Hypertension Father   ? Cancer Other   ? Cancer Mother   ?     pancreatic/whipple/chemo  ? Hypertension Mother   ? Cancer Maternal Grandmother   ?     lung smoker  ? Cancer Paternal Grandmother   ?     elbow per pt   ? ? ?Social History  ? ?Social History Narrative  ? 4 kids   ? Single   ? Some college, truck driver   ? No guns   ? Wears seat belt   ? Safe in relationship  ? Truck driver for [employer]   ? Carmen is penny Cooley   ?  ? ?Review of Systems ?General: Denies fevers, chills, weight loss ?CV: Denies chest pain, shortness of breath, palpitations ? ?Physical Exam ? ?  12/17/2021  ?  9:09 AM 01/16/2021  ?  5:00 AM 01/16/2021  ?  4:30 AM  ?Vitals with BMI  ?Height 5' 5.945"    ?Weight 234 lbs    ?BMI 37.83    ?Systolic 622 93 297  ?  Diastolic 84 64 60  ?Pulse 71 66 65  ?  ?General:  No acute distress,  Alert and oriented, Non-Toxic, Normal speech and affect ?Abdomen: Abdomen is soft and nontender.  She does seem to have a small umbilical hernia that is tender however.  She has a lower C-section scar that is well-healed.  She has moderate excess skin and adipose tissue in the infra and supraumbilical areas. ? ?Assessment/Plan ?Patient presents with abdominal contour concerns and skin rashes beneath her abdominal skin.  She is interested in surgical correction.  I had a long discussion with her about panniculectomy versus abdominoplasty and how liposuction would work with those procedures.  We discussed risks include bleeding, infection, damage to surrounding structures and need for additional procedures.  Ultimately I recommended that she continue on her weight loss trajectory and revisit the surgery a few months down the line.  She would like to lose another 50 pounds  approximately.  We will plan to sit down a few months from now and review her progress and decide about surgery timing.  I do think we could have general surgery evaluate her and fix her umbilical hernia at the time of abdominal contouring. ? ?[name] ?01/01/2022, 12:06 PM  ? ? ?  ?

## 2022-01-15 ENCOUNTER — Ambulatory Visit: Payer: 59

## 2022-01-16 ENCOUNTER — Ambulatory Visit
Admission: RE | Admit: 2022-01-16 | Discharge: 2022-01-16 | Disposition: A | Discharge status: Home / Self Care | Payer: 59 | Source: Ambulatory Visit | Attending: Internal Medicine | Admitting: Internal Medicine

## 2022-01-16 ENCOUNTER — Telehealth: Payer: Self-pay

## 2022-01-16 DIAGNOSIS — E041 Nontoxic single thyroid nodule: Secondary | ICD-10-CM | POA: Diagnosis present

## 2022-01-16 NOTE — Telephone Encounter (Signed)
Lvm for pt to return call in regards to [place] results. ? ?Per Dr.Tracy: ?Multinodular goiter  ?1 left thyroid nodule reduced in size f/u in 1 year  ?

## 2022-03-11 ENCOUNTER — Ambulatory Visit: Payer: 59 | Admitting: Plastic Surgery

## 2022-03-19 ENCOUNTER — Emergency Department
Admission: EM | Admit: 2022-03-19 | Discharge: 2022-03-19 | Disposition: A | Discharge status: Home / Self Care | Payer: 59 | Attending: Student in an Organized Health Care Education/Training Program | Admitting: Student in an Organized Health Care Education/Training Program

## 2022-03-19 DIAGNOSIS — J029 Acute pharyngitis, unspecified: Secondary | ICD-10-CM | POA: Diagnosis not present

## 2022-03-19 DIAGNOSIS — Z20822 Contact with and (suspected) exposure to covid-19: Secondary | ICD-10-CM | POA: Diagnosis not present

## 2022-03-19 DIAGNOSIS — J069 Acute upper respiratory infection, unspecified: Secondary | ICD-10-CM | POA: Diagnosis not present

## 2022-03-19 LAB — RESP PANEL BY RT-PCR (FLU A&B, COVID) ARPGX2
Influenza A by PCR: NEGATIVE
Influenza B by PCR: NEGATIVE
SARS Coronavirus 2 by RT PCR: NEGATIVE

## 2022-03-19 LAB — GROUP A STREP BY PCR: Group A Strep by PCR: NOT DETECTED

## 2022-03-19 MED ORDER — CLARITIN-D 12 HOUR 5-120 MG PO TB12: 1.0000 | ORAL_TABLET | Freq: Two times a day (BID) | ORAL | 0 refills | Status: AC

## 2022-03-19 MED ORDER — BENZONATATE 100 MG PO CAPS: 100.0000 mg | ORAL_CAPSULE | Freq: Three times a day (TID) | ORAL | 0 refills | Status: AC | PRN

## 2022-03-19 NOTE — ED Provider Triage Note (Signed)
Emergency Medicine Provider Triage Evaluation Note  Carmen Cooley , a 56 y.o. female  was evaluated in triage.  Pt complains of sore throat x 2 days. She has started to lose her voice today. Occasional cough. No known fever.  Review of Systems  Positive:  Negative:   Physical Exam  BP (!) 146/94   Pulse (!) 102   Temp 99.5 F (37.5 C) (Oral)   Resp 18   SpO2 96%  Gen:   Awake, no distress   Resp:  Normal effort  MSK:   Moves extremities without difficulty  Other:    Medical Decision Making  Medically screening exam initiated at 6:18 PM.  Appropriate orders placed.  Carmen Cooley was informed that the remainder of the evaluation will be completed by another provider, this initial triage assessment does not replace that evaluation, and the importance of remaining in the ED until their evaluation is complete.   Victorino Dike, FNP 03/19/22 1819

## 2022-03-19 NOTE — Discharge Instructions (Signed)
Please alternate Tylenol and ibuprofen as needed for sore throat pain.  You may take Claritin-D and Tessalon Perles as needed for cough congestion runny nose symptoms.  Return to the ER for any fevers, difficulty breathing, shortness of breath, difficulty swallowing, worsening symptoms or urgent changes in your health

## 2022-03-19 NOTE — ED Triage Notes (Signed)
Pt comes pov with sore throat for the past 2 days. States they had the [facility] and fan going at the same time and it made her throat hurt.

## 2022-03-19 NOTE — ED Provider Notes (Cosign Needed)
[facility] EMERGENCY DEPARTMENT Provider Note   CSN: [id number] Arrival date & time: 03/19/22  1807     History  Chief Complaint  Patient presents with   Sore Throat    Carmen Cooley is a 56 y.o. female.  Presents to the emergency department for evaluation of sore throat, runny nose, cough.  Patient describes significant nasal congestion.  Her symptoms been present for 2 days.  She denies any chest pain, shortness of breath, rashes, nausea vomiting or diarrhea.  She has been afebrile.  She has not any medications for symptoms.  She denies any past medical history or prescription medications.  HPI     Home Medications Prior to Admission medications   Medication Sig Start Date End Date Taking? Authorizing Provider  benzonatate (TESSALON PERLES) 100 MG capsule Take 1 capsule (100 mg total) by mouth 3 (three) times daily as needed for cough. 03/19/22 03/19/23 Yes Duanne Guess, PA-C  loratadine-pseudoephedrine (CLARITIN-D 12 HOUR) 5-120 MG tablet Take 1 tablet by mouth 2 (two) times daily. 03/19/22 03/19/23 Yes Duanne Guess, PA-C  albuterol (VENTOLIN HFA) 108 (90 Base) MCG/ACT inhaler Inhale 2 puffs into the lungs every 6 (six) hours as needed for wheezing or shortness of breath. 01/16/21   Alfred Levins, [place], MD  Cholecalciferol 1.25 MG (50000 UT) capsule Take 1 capsule (50,000 Units total) by mouth once a week. D3 12/17/21   McLean-Scocuzza, Nino Glow, MD  Multiple Vitamin (MULTIVITAMIN) tablet Take 1 tablet by mouth daily.    [provider]      Allergies    Compazine [prochlorperazine] and Contrast media [iodinated contrast media]    Review of Systems   Review of Systems  Physical Exam Updated Vital Signs BP (!) 146/94   Pulse (!) 102   Temp 99.5 F (37.5 C) (Oral)   Resp 18   SpO2 96%  Physical Exam Constitutional:      General: She is not in acute distress.    Appearance: She is well-developed.  HENT:     Head: Normocephalic and  atraumatic.     Jaw: No trismus.     Right Ear: Hearing, tympanic membrane, ear canal and external ear normal.     Left Ear: Hearing, tympanic membrane, ear canal and external ear normal.     Nose: Rhinorrhea present.     Mouth/Throat:     Mouth: Mucous membranes are dry. No oral lesions.     Pharynx: Uvula midline. Posterior oropharyngeal erythema present. No pharyngeal swelling, oropharyngeal exudate or uvula swelling.     Tonsils: No tonsillar exudate or tonsillar abscesses.  Eyes:     General:        Right eye: No discharge.        Left eye: No discharge.     Conjunctiva/sclera: Conjunctivae normal.  Cardiovascular:     Rate and Rhythm: Normal rate and regular rhythm.     Heart sounds: Normal heart sounds.  Pulmonary:     Effort: Pulmonary effort is normal. No respiratory distress.     Breath sounds: Normal breath sounds. No stridor. No wheezing or rales.  Abdominal:     General: There is no distension.     Palpations: Abdomen is soft.     Tenderness: There is no abdominal tenderness.  Musculoskeletal:        General: No deformity. Normal range of motion.     Cervical back: Normal range of motion.  Lymphadenopathy:     Cervical: Cervical adenopathy (posterior  cervical adenopathy) present.  Skin:    General: Skin is warm and dry.  Neurological:     General: No focal deficit present.     Mental Status: She is alert and oriented to person, place, and time.     Deep Tendon Reflexes: Reflexes are normal and symmetric.  Psychiatric:        Behavior: Behavior normal.        Thought Content: Thought content normal.     ED Results / Procedures / Treatments   Labs (all labs ordered are listed, but only abnormal results are displayed) Labs Reviewed  GROUP A STREP BY PCR  RESP PANEL BY RT-PCR (FLU A&B, COVID) [id number]    EKG None  Radiology No results found.  Procedures Procedures    Medications Ordered in ED Medications - No data to display  ED Course/ Medical  Decision Making/ A&P                           Medical Decision Making Risk OTC drugs. Prescription drug management.   57 year old female with cough congestion runny nose and sore throat.  Symptoms x2 days.  She is afebrile.  No chills, body aches, nausea or vomiting, chest pain or shortness of breath.  Physical exam unremarkable except for mild posterior cervical lymphadenopathy.  Lungs are clear to auscultation with no wheezing rales or rhonchi.  Vital signs are stable.  Patient is placed on Claritin-D, Tessalon Perles and will take Tylenol and ibuprofen as needed.  She understands signs and symptoms return to the ER for. Final Clinical Impression(s) / ED Diagnoses Final diagnoses:  Pharyngitis, unspecified etiology  Viral upper respiratory tract infection    Rx / DC Orders ED Discharge Orders          Ordered    loratadine-pseudoephedrine (CLARITIN-D 12 HOUR) 5-120 MG tablet  2 times daily        03/19/22 2034    benzonatate (TESSALON PERLES) 100 MG capsule  3 times daily PRN        03/19/22 2034              Duanne Guess, PA-C 03/19/22 2037

## 2022-06-19 ENCOUNTER — Ambulatory Visit: Payer: 59 | Admitting: Internal Medicine

## 2022-08-06 ENCOUNTER — Telehealth: Payer: Self-pay | Admitting: Internal Medicine

## 2022-08-06 NOTE — Telephone Encounter (Signed)
Patient has a lab appt 08/14/2022, there are no orders in.

## 2022-08-11 ENCOUNTER — Telehealth: Payer: Self-pay | Admitting: Family

## 2022-08-11 NOTE — Telephone Encounter (Signed)
Called Patient No answer. Left a Voicemail to call our office back so we can find out who her new health care provider is.

## 2022-08-14 ENCOUNTER — Other Ambulatory Visit: Payer: 59

## 2022-08-19 ENCOUNTER — Ambulatory Visit: Payer: 59 | Admitting: Internal Medicine

## 2022-09-16 ENCOUNTER — Ambulatory Visit: Payer: 59 | Admitting: Internal Medicine

## 2022-10-07 NOTE — Progress Notes (Deleted)
Name: Carmen Cooley  MRN/ DOB: [id number], 13-Aug-1966    Age/ Sex: 57 y.o., female    PCP: McLean-Scocuzza, Nino Glow, MD   Reason for Endocrinology Evaluation: MNG     Date of Initial Endocrinology Evaluation: 10/07/2022     HPI: Ms. Carmen Cooley is a 57 y.o. female with a past medical history of ***. The patient presented for initial endocrinology clinic visit on 10/07/2022 for consultative assistance with her MNG.   Patient was diagnosed with multinodular goiter on thyroid ultrasound 10/04/2019, none of the nodules at the time needed FNA criteria but follow-up was recommended.  Her latest thyroid ultrasound was in April 2023, continue to show multinodular goiter with a follow-up recommended but none met criteria for FNA at the time.     HISTORY:  Past Medical History:  Past Medical History:  Diagnosis Date   Allergy    Brain tumor ([place])    benign   COVID-19    10/01/20   DM type 2 (diabetes mellitus, type 2) ([place]) 06/17/2020   History of blood transfusion    Migraines    Seizure ([place])    UTI (urinary tract infection)    Past Surgical History:  Past Surgical History:  Procedure Laterality Date   BRAIN TUMOR EXCISION     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    CESAREAN SECTION     x 1   COLONOSCOPY WITH PROPOFOL N/A 11/16/2018   Procedure: COLONOSCOPY WITH PROPOFOL;  Surgeon: Jonathon Bellows, MD;  Location: [facility] ENDOSCOPY;  Service: Gastroenterology;  Laterality: N/A;   FLEXIBLE SIGMOIDOSCOPY N/A 05/25/2019   Procedure: FLEXIBLE SIGMOIDOSCOPY;  Surgeon: Jonathon Bellows, MD;  Location: [facility] ENDOSCOPY;  Service: Gastroenterology;  Laterality: N/A;   FOOT SURGERY     KNEE SURGERY      Social History:  reports that she quit smoking about 4 years ago. Her smoking use included cigarettes. She has a 16.50 pack-year smoking history. She has never used smokeless tobacco. She reports that she does not currently use alcohol. She reports that she does not use drugs. Family History: family history  includes Cancer in her father, maternal grandmother, mother, paternal grandmother, and another family member; Diabetes in her father; Hypertension in her father and mother.   HOME MEDICATIONS: Allergies as of 10/08/2022       Reactions   Compazine [prochlorperazine] Shortness Of Breath   Contrast Media [iodinated Contrast Media] Hives        Medication List        Accurate as of October 07, 2022 12:52 PM. If you have any questions, ask your nurse or doctor.          albuterol 108 (90 Base) MCG/ACT inhaler Commonly known as: VENTOLIN HFA Inhale 2 puffs into the lungs every 6 (six) hours as needed for wheezing or shortness of breath.   benzonatate 100 MG capsule Commonly known as: Tessalon Perles Take 1 capsule (100 mg total) by mouth 3 (three) times daily as needed for cough.   Cholecalciferol 1.25 MG (50000 UT) capsule Take 1 capsule (50,000 Units total) by mouth once a week. D3   Claritin-D 12 Hour 5-120 MG tablet Generic drug: loratadine-pseudoephedrine Take 1 tablet by mouth 2 (two) times daily.   multivitamin tablet Take 1 tablet by mouth daily.          REVIEW OF SYSTEMS: A comprehensive ROS was conducted with the patient and is negative except as per HPI and below:  ROS  OBJECTIVE:  VS: There were no vitals taken for this visit.   Wt Readings from Last 3 Encounters:  01/01/22 234 lb 9.6 oz (106.4 kg)  12/17/21 234 lb (106.1 kg)  01/16/21 135 lb (61.2 kg)     EXAM: General: Pt appears well and is in NAD  Eyes: External eye exam normal without stare, lid lag or exophthalmos.  EOM intact.  PERRL.  Neck: General: Supple without adenopathy. Thyroid: Thyroid size normal.  No goiter or nodules appreciated. No thyroid bruit.  Lungs: Clear with good BS bilat with no rales, rhonchi, or wheezes  Heart: Auscultation: RRR.  Abdomen: Normoactive bowel sounds, soft, nontender, without masses or organomegaly palpable  Extremities:  BL LE: No pretibial  edema normal ROM and strength.  Mental Status: Judgment, insight: Intact Orientation: Oriented to time, place, and person Mood and affect: No depression, anxiety, or agitation     DATA REVIEWED: ***   Thyroid Ultrasound 01/16/2022   Estimated total number of nodules >/= 1 cm: 1   Number of spongiform nodules >/=  2 cm not described below (TR1): 0   Number of mixed cystic and solid nodules >/= 1.5 cm not described below (TR2): 0   _________________________________________________________   Nodule labeled 1 is a solid isoechoic TR 3 nodule in the inferior left thyroid lobe that measures 1.7 x 1.5 x 0.8 cm on today's exam (see key images), previously measuring up to 2.0 cm. It appears slightly decreased in size with similar morphology. *Given size (>/= 1.5 - 2.4 cm) and appearance, a follow-up ultrasound in 1 year should be considered based on TI-RADS criteria.   Multiple other small subcentimeter thyroid nodules scattered throughout the thyroid gland do not meet criteria for further dedicated follow-up or biopsy.   IMPRESSION: 1. Multinodular thyroid gland without new suspicious thyroid nodule. 2. Nodule labeled 1 in the inferior left thyroid lobe is similar to slightly decreased in size, and continues to meet criteria for follow-up ultrasound in 1 year. This exam marks 3 year stability. A total follow-up interval of 5 years is recommended.    ASSESSMENT/PLAN/RECOMMENDATIONS:   MNG:    Medications :  Signed electronically by: Mack Guise, MD  [facility]  [facility] [address] [place], [place] [address] Phone: [phone] FAX: [phone]   CC: McLean-Scocuzza, Nino Glow, MD [place] [address] Phone: [phone] Fax: [phone]   Return to Endocrinology clinic as below: Future Appointments  Date Time Provider [place]  10/08/2022  8:30 AM Zakariah Dejarnette, Melanie Crazier, MD  [facility] None

## 2022-10-08 ENCOUNTER — Ambulatory Visit: Payer: 59 | Admitting: Internal Medicine
